# Patient Record
Sex: Male | Born: 1960 | Race: Black or African American | Hispanic: No | Marital: Married | Smoking: Never smoker
Health system: Southern US, Community
[De-identification: ages and names within clinical notes are randomized; demographics above are authoritative.]

## PROBLEM LIST (undated history)

## (undated) DIAGNOSIS — I10 Essential (primary) hypertension: Secondary | ICD-10-CM

## (undated) DIAGNOSIS — J309 Allergic rhinitis, unspecified: Secondary | ICD-10-CM

## (undated) HISTORY — DX: Allergic rhinitis, unspecified: J30.9

## (undated) HISTORY — DX: Essential (primary) hypertension: I10

## (undated) HISTORY — PX: KNEE SURGERY: SHX244

---

## 1999-01-21 ENCOUNTER — Emergency Department (HOSPITAL_COMMUNITY): Admission: EM | Admit: 1999-01-21 | Discharge: 1999-01-21 | Payer: Self-pay | Admitting: Emergency Medicine

## 1999-01-21 ENCOUNTER — Encounter: Payer: Self-pay | Admitting: Emergency Medicine

## 1999-02-04 ENCOUNTER — Emergency Department (HOSPITAL_COMMUNITY): Admission: EM | Admit: 1999-02-04 | Discharge: 1999-02-04 | Payer: Self-pay

## 1999-10-18 ENCOUNTER — Emergency Department (HOSPITAL_COMMUNITY): Admission: EM | Admit: 1999-10-18 | Discharge: 1999-10-19 | Payer: Self-pay | Admitting: Emergency Medicine

## 1999-10-19 ENCOUNTER — Encounter: Payer: Self-pay | Admitting: Emergency Medicine

## 2000-07-09 ENCOUNTER — Emergency Department (HOSPITAL_COMMUNITY): Admission: EM | Admit: 2000-07-09 | Discharge: 2000-07-09 | Payer: Self-pay | Admitting: Emergency Medicine

## 2000-09-27 ENCOUNTER — Emergency Department (HOSPITAL_COMMUNITY): Admission: EM | Admit: 2000-09-27 | Discharge: 2000-09-28 | Payer: Self-pay | Admitting: Emergency Medicine

## 2000-10-06 ENCOUNTER — Emergency Department (HOSPITAL_COMMUNITY): Admission: EM | Admit: 2000-10-06 | Discharge: 2000-10-06 | Payer: Self-pay | Admitting: Emergency Medicine

## 2000-11-25 ENCOUNTER — Emergency Department (HOSPITAL_COMMUNITY): Admission: EM | Admit: 2000-11-25 | Discharge: 2000-11-25 | Payer: Self-pay | Admitting: Emergency Medicine

## 2000-11-25 ENCOUNTER — Encounter: Payer: Self-pay | Admitting: Emergency Medicine

## 2000-11-27 ENCOUNTER — Encounter: Payer: Self-pay | Admitting: Emergency Medicine

## 2000-11-27 ENCOUNTER — Emergency Department (HOSPITAL_COMMUNITY): Admission: EM | Admit: 2000-11-27 | Discharge: 2000-11-27 | Payer: Self-pay | Admitting: Emergency Medicine

## 2000-12-04 ENCOUNTER — Emergency Department (HOSPITAL_COMMUNITY): Admission: EM | Admit: 2000-12-04 | Discharge: 2000-12-04 | Payer: Self-pay | Admitting: Emergency Medicine

## 2003-08-26 ENCOUNTER — Emergency Department (HOSPITAL_COMMUNITY): Admission: EM | Admit: 2003-08-26 | Discharge: 2003-08-26 | Payer: Self-pay | Admitting: Emergency Medicine

## 2004-05-27 ENCOUNTER — Encounter: Admission: RE | Admit: 2004-05-27 | Discharge: 2004-05-27 | Payer: Self-pay | Admitting: Family Medicine

## 2004-08-02 ENCOUNTER — Emergency Department (HOSPITAL_COMMUNITY): Admission: EM | Admit: 2004-08-02 | Discharge: 2004-08-02 | Payer: Self-pay | Admitting: Emergency Medicine

## 2004-08-23 ENCOUNTER — Encounter: Admission: RE | Admit: 2004-08-23 | Discharge: 2004-08-23 | Payer: Self-pay | Admitting: Family Medicine

## 2006-02-23 ENCOUNTER — Ambulatory Visit: Payer: Self-pay | Admitting: Family Medicine

## 2007-01-01 ENCOUNTER — Encounter: Admission: RE | Admit: 2007-01-01 | Discharge: 2007-01-01 | Payer: Self-pay | Admitting: Cardiovascular Disease

## 2007-04-24 DIAGNOSIS — I10 Essential (primary) hypertension: Secondary | ICD-10-CM | POA: Insufficient documentation

## 2008-04-14 ENCOUNTER — Telehealth: Payer: Self-pay | Admitting: Family Medicine

## 2008-04-20 ENCOUNTER — Telehealth: Payer: Self-pay | Admitting: Family Medicine

## 2008-09-18 ENCOUNTER — Telehealth: Payer: Self-pay | Admitting: Family Medicine

## 2008-10-29 ENCOUNTER — Ambulatory Visit: Payer: Self-pay | Admitting: Family Medicine

## 2008-10-29 LAB — CONVERTED CEMR LAB
ALT: 32 units/L (ref 0–53)
AST: 27 units/L (ref 0–37)
Albumin: 4.4 g/dL (ref 3.5–5.2)
Alkaline Phosphatase: 44 units/L (ref 39–117)
BUN: 16 mg/dL (ref 6–23)
Basophils Absolute: 0 10*3/uL (ref 0.0–0.1)
Basophils Relative: 0.3 % (ref 0.0–3.0)
Bilirubin Urine: NEGATIVE
Bilirubin, Direct: 0.2 mg/dL (ref 0.0–0.3)
CO2: 31 meq/L (ref 19–32)
Calcium: 9.3 mg/dL (ref 8.4–10.5)
Chloride: 104 meq/L (ref 96–112)
Cholesterol: 205 mg/dL — ABNORMAL HIGH (ref 0–200)
Creatinine, Ser: 1.1 mg/dL (ref 0.4–1.5)
Direct LDL: 147.8 mg/dL
Eosinophils Absolute: 0.1 10*3/uL (ref 0.0–0.7)
Eosinophils Relative: 4.1 % (ref 0.0–5.0)
GFR calc non Af Amer: 91.31 mL/min (ref >60)
Glucose, Bld: 94 mg/dL (ref 70–99)
Glucose, Urine, Semiquant: NEGATIVE
HCT: 44.7 % (ref 39.0–52.0)
HDL: 41 mg/dL (ref >39.00)
Hemoglobin: 15.6 g/dL (ref 13.0–17.0)
Ketones, urine, test strip: NEGATIVE
Lymphocytes Relative: 44.1 % (ref 12.0–46.0)
Lymphs Abs: 1.5 10*3/uL (ref 0.7–4.0)
MCHC: 34.9 g/dL (ref 30.0–36.0)
MCV: 92.6 fL (ref 78.0–100.0)
Monocytes Absolute: 0.2 10*3/uL (ref 0.1–1.0)
Monocytes Relative: 6.6 % (ref 3.0–12.0)
Neutro Abs: 1.5 10*3/uL (ref 1.4–7.7)
Neutrophils Relative %: 44.9 % (ref 43.0–77.0)
Nitrite: NEGATIVE
PSA: 0.42 ng/mL (ref 0.10–4.00)
Platelets: 186 10*3/uL (ref 150.0–400.0)
Potassium: 3.4 meq/L — ABNORMAL LOW (ref 3.5–5.1)
Protein, U semiquant: NEGATIVE
RBC: 4.82 M/uL (ref 4.22–5.81)
RDW: 13.3 % (ref 11.5–14.6)
Sodium: 141 meq/L (ref 135–145)
Specific Gravity, Urine: 1.015
TSH: 1.33 microintl units/mL (ref 0.35–5.50)
Total Bilirubin: 1.6 mg/dL — ABNORMAL HIGH (ref 0.3–1.2)
Total CHOL/HDL Ratio: 5
Total Protein: 7.9 g/dL (ref 6.0–8.3)
Triglycerides: 67 mg/dL (ref 0.0–149.0)
Urobilinogen, UA: 0.2
VLDL: 13.4 mg/dL (ref 0.0–40.0)
WBC Urine, dipstick: NEGATIVE
WBC: 3.3 10*3/uL — ABNORMAL LOW (ref 4.5–10.5)
pH: 6.5

## 2008-11-05 ENCOUNTER — Ambulatory Visit: Payer: Self-pay | Admitting: Family Medicine

## 2008-11-05 DIAGNOSIS — F528 Other sexual dysfunction not due to a substance or known physiological condition: Secondary | ICD-10-CM | POA: Insufficient documentation

## 2008-11-05 DIAGNOSIS — J309 Allergic rhinitis, unspecified: Secondary | ICD-10-CM | POA: Insufficient documentation

## 2008-12-17 ENCOUNTER — Ambulatory Visit: Payer: Self-pay | Admitting: Family Medicine

## 2009-12-31 ENCOUNTER — Telehealth: Payer: Self-pay | Admitting: Family Medicine

## 2010-02-22 ENCOUNTER — Observation Stay (HOSPITAL_COMMUNITY): Admission: EM | Admit: 2010-02-22 | Discharge: 2010-02-23 | Payer: Self-pay | Admitting: Emergency Medicine

## 2010-03-31 ENCOUNTER — Ambulatory Visit: Payer: Self-pay | Admitting: Family Medicine

## 2010-05-27 ENCOUNTER — Ambulatory Visit: Payer: Self-pay | Admitting: Family Medicine

## 2010-07-08 ENCOUNTER — Ambulatory Visit: Payer: Self-pay | Admitting: Family Medicine

## 2010-08-30 NOTE — Progress Notes (Signed)
Summary: REFILL  Phone Note Call from Patient Call back at Home Phone [phone]   Caller: PT LIVE Call For: Lorence Nagengast Summary of Call: [facility] [address] [place] TRIAMTERENE HCTZ 5-25 HE MADE HIS APPT FOR CPX ON 4-8.  PLKEASE FILL TILL THEN.   Initial call taken by: Roselle Locus,  September 18, 2008 3:22 PM    New/Updated Medications: MAXZIDE-25 37.5-25 MG  TABS (TRIAMTERENE-HCTZ) take one tab once daily HYDROCHLOROTHIAZIDE 12.5 MG  CAPS (HYDROCHLOROTHIAZIDE) take one tab once daily   Prescriptions: HYDROCHLOROTHIAZIDE 12.5 MG  CAPS (HYDROCHLOROTHIAZIDE) take one tab once daily  #90 x 0   Entered by:   Kern Reap CMA   Authorized by:   Roderick Pee MD   Signed by:   Kern Reap CMA on 09/18/2008   Method used:   Electronically to        [facility] [phone]* (retail)       [address]       [place], [place]  [address]       Ph: [phone]       Fax: [phone]   RxID:   [id number] MAXZIDE-25 37.5-25 MG  TABS (TRIAMTERENE-HCTZ) take one tab once daily  #90 x 0   Entered by:   Kern Reap CMA   Authorized by:   Roderick Pee MD   Signed by:   Kern Reap CMA on 09/18/2008   Method used:   Electronically to        [facility] [phone]* (retail)       [address]       [place], [place]  [address]       Ph: [phone]       Fax: [phone]   RxID:   [id number] TRIAMTERENE-HCTZ 37.5-25 MG  TABS (TRIAMTERENE-HCTZ) 1 TAB  QAM  #90 x 0   Entered by:   Kern Reap CMA   Authorized by:   Roderick Pee MD   Signed by:   Kern Reap CMA on 09/18/2008   Method used:   Electronically to        [facility] [phone]* (retail)       [address]       [place], [place]  [address]       Ph: [phone]       Fax: [phone]   RxID:   [id number]

## 2010-08-30 NOTE — Progress Notes (Signed)
Summary: triam denied  Phone Note From Pharmacy   Caller: [facility] [phone]* Details for Reason: refill Summary of Call: pharm request refill triam/hctz  Follow-up for Phone Call        denied. Follow-up by: Kern Reap CMA,  April 14, 2008 2:00 PM      Prescriptions: TRIAMTERENE-HCTZ 37.5-25 MG  TABS (TRIAMTERENE-HCTZ) 1 TAB  QAM  #0 x 0   Entered by:   Kern Reap CMA   Authorized by:   Roderick Pee MD   Signed by:   Kern Reap CMA on 04/14/2008   Method used:   Electronically to        [facility] [phone]* (retail)       [address]       [place], [place]  [address]       Ph: [phone]       Fax: [phone]   RxID:   [id number]

## 2010-08-30 NOTE — Assessment & Plan Note (Signed)
Summary: 3 wk rov/njr/pt rescd//ccm   Vital Signs:  Patient profile:   50 year old male Weight:      211 pounds Temp:     98.2 degrees F oral BP sitting:   160 / 110  (left arm) Cuff size:   regular  Vitals Entered By: Kern Reap CMA (Dec 17, 2008 10:35 AM)  Reason for Visit follow up HTN  History of Present Illness: Marsden is a 50 year old male, who comes in today for evaluation of hypertension.  In April.  We started him on lisinopril 10 mg daily.  He, states she's been compliant with his medication, but he is not been checking his blood pressure at home.  BP here initially 160/108 with his machine 169/109 after rest 144/90.  He's had no side effects from his medication  Allergies (verified): No Known Drug Allergies PMH-FH-SH reviewed-no changes except otherwise noted  Family History:    Reviewed history from 11/05/2008 and no changes required:       Family History Hypertension       Father: HTN       Mother: deceased HTN       Siblings: 3 brothers - HTN 3 sisters HTN  Social History:    Reviewed history from 04/24/2007 and no changes required:       Occupation:       Married       Never Smoked       Alcohol use-no       Drug use-no  Review of Systems      See HPI  Physical Exam  General:  Well-developed,well-nourished,in no acute distress; alert,appropriate and cooperative throughout examination Heart:  144/90   Impression & Recommendations:  Problem # 1:  HYPERTENSION (ICD-401.9) Assessment Improved  His updated medication list for this problem includes:    Lisinopril 10 Mg Tabs (Lisinopril) .Marland Kitchen... Take 1 tablet by mouth every morning    Lisinopril 20 Mg Tabs (Lisinopril) .Marland Kitchen... Take 1 tablet by mouth every morning  Orders: Prescription Created Electronically [phone])  Complete Medication List: 1)  Lisinopril 10 Mg Tabs (Lisinopril) .... Take 1 tablet by mouth every morning 2)  Viagra 50 Mg Tabs (Sildenafil citrate) .... Uad 3)  Lisinopril 20 Mg  Tabs (Lisinopril) .... Take 1 tablet by mouth every morning  Patient Instructions: 1)  increase the lisinopril to 20 mg daily.  Check a morning blood sugar daily for 3 weeks.  If at the end of 3 weeks.  Her blood pressure is normal.  That is the top number is less than 135 and the bottom number is less than 85, then we are at goal. 2)  If after 3 weeks if blood pressure is not at goal to increase your lisinopril to 30 mg daily. 3)  In other words we will increase her lisinopril by 10 mg every 3 weeks and two, your blood pressure drops to normal.  maximum dosage  is 80 mg daily. Prescriptions: LISINOPRIL 20 MG TABS (LISINOPRIL) Take 1 tablet by mouth every morning  #100 x 3   Entered and Authorized by:   Roderick Pee MD   Signed by:   Roderick Pee MD on 12/17/2008   Method used:   Electronically to        [facility] [phone]* (retail)       [address]       [place], [place]  [address]  Ph: [phone]       Fax: [phone]   RxID:   [id number]

## 2010-08-30 NOTE — Progress Notes (Signed)
Summary: rx refill  Phone Note Refill Request Message from:  Fax from Pharmacy on December 31, 2009 1:01 PM  Refills Requested: Medication #1:  LISINOPRIL 20 MG TABS Take 1 tablet by mouth every morning. Initial call taken by: Kern Reap CMA (AAMA),  December 31, 2009 1:01 PM    Prescriptions: LISINOPRIL 10 MG TABS (LISINOPRIL) Take 1 tablet by mouth every morning  #30 x 0   Entered by:   Kern Reap CMA (AAMA)   Authorized by:   Roderick Pee MD   Signed by:   Kern Reap CMA (AAMA) on 12/31/2009   Method used:   [facility]        [phone]       [address]       [place], [place]  [address]       Ph: [phone] or [phone]       Fax: [phone]   RxID:   [id number]

## 2010-08-30 NOTE — Assessment & Plan Note (Signed)
Summary: 4 weeks fup//ccm---[facility] // RS PT RSC/NJR   Vital Signs:  Patient profile:   50 year old male Height:      68 inches Weight:      203 pounds BMI:     30.98 Temp:     98.5 degrees F oral BP sitting:   150 / 98  (left arm) Cuff size:   regular  Vitals Entered By: Kern Reap CMA Duncan Dull) (May 27, 2010 10:30 AM) CC: follow-up visit   CC:  follow-up visit.  History of Present Illness: Phillip Scott is a 50 year old male, who comes in today for follow-up of hypertension.  We increased his Norvasc to 5 mg daily on September the first.  BP 150/98.  Blood pressure at home are consistent with what were getting here.  Allergies: 1)  ! * Lisinopril  Social History: Reviewed history from 04/24/2007 and no changes required. Occupation: Married Never Smoked Alcohol use-no Drug use-no  Review of Systems      See HPI       Flu Vaccine Consent Questions     Do you have a history of severe allergic reactions to this vaccine? no    Any prior history of allergic reactions to egg and/or gelatin? no    Do you have a sensitivity to the preservative Thimersol? no    Do you have a past history of Guillan-Barre Syndrome? no    Do you currently have an acute febrile illness? no    Have you ever had a severe reaction to latex? no    Vaccine information given and explained to patient? yes    Are you currently pregnant? no    Lot Number:[id number]   Exp Date:01/28/2011   Site Given  Left Deltoid IM    Physical Exam  General:  Well-developed,well-nourished,in no acute distress; alert,appropriate and cooperative throughout examination Extremities:  1+ left pedal edema and 1+ right pedal edema.     Impression & Recommendations:  Problem # 1:  HYPERTENSION (ICD-401.9) Assessment Unchanged  His updated medication list for this problem includes:    Amlodipine Besylate 10 Mg Tabs (Amlodipine besylate) .Marland Kitchen... Take 1 tablet by mouth every morning    Maxzide-25 37.5-25 Mg Tabs  (Triamterene-hctz) .Marland Kitchen... Take 1 tablet by mouth every morning  Orders: Prescription Created Electronically [phone])  Complete Medication List: 1)  Viagra 50 Mg Tabs (Sildenafil citrate) .... Uad 2)  Amlodipine Besylate 10 Mg Tabs (Amlodipine besylate) .... Take 1 tablet by mouth every morning 3)  Maxzide-25 37.5-25 Mg Tabs (Triamterene-hctz) .... Take 1 tablet by mouth every morning  Other Orders: Admin 1st Vaccine (60454) Flu Vaccine 55yrs + (09811)  Patient Instructions: 1)  continued the amlodipine 10 mg daily in the morning.  And Maxide 25 mg one tablet daily in the morning. 2)  Check your blood pressure daily in the morning.  Return in one month for follow-up with the data and the device Prescriptions: MAXZIDE-25 37.5-25 MG TABS (TRIAMTERENE-HCTZ) Take 1 tablet by mouth every morning  #100 x 3   Entered and Authorized by:   Roderick Pee MD   Signed by:   Roderick Pee MD on 05/27/2010   Method used:   Electronically to        [facility] [address] (retail)       [phone]       [place], [place]  [address]       Ph: [phone]  Fax: [phone]   RxID:   [id number]    Orders Added: 1)  Admin 1st Vaccine [90471] 2)  Flu Vaccine 3yrs + [30865] 3)  Prescription Created Electronically [G8553] 4)  Est. Patient Level III [78469]

## 2010-08-30 NOTE — Progress Notes (Signed)
Summary: REFILL  Phone Note Call from Patient Call back at Home Phone [phone] Call back at [phone]   Caller: PT LIVE Call For: Janvi Ammar Summary of Call: TRIAMTERINE REFILL [facility]  Initial call taken by: Roselle Locus,  April 20, 2008 9:39 AM  Follow-up for Phone Call        patient has not been seen in 2 year.  rx has been denied. Follow-up by: Kern Reap CMA,  April 20, 2008 9:49 AM  Additional Follow-up for Phone Call Additional follow up Details #1::        okay per dr Kaijah Abts Additional Follow-up by: Kern Reap CMA,  April 20, 2008 1:27 PM      Prescriptions: TRIAMTERENE-HCTZ 37.5-25 MG  TABS (TRIAMTERENE-HCTZ) 1 TAB  QAM  #100 x 0   Entered by:   Kern Reap CMA   Authorized by:   Roderick Pee MD   Signed by:   Kern Reap CMA on 04/20/2008   Method used:   Electronically to        [facility] [phone]* (retail)       [address]       [place], [place]  [address]       Ph: [phone]       Fax: [phone]   RxID:   [id number]

## 2010-08-30 NOTE — Assessment & Plan Note (Signed)
Summary: post hos fup ?meds/njr   Vital Signs:  Patient profile:   50 year old male Weight:      204 pounds Temp:     97.9 degrees F oral BP sitting:   150 / 98  (left arm) Cuff size:   regular  Vitals Entered By: Kathrynn Speed CMA (March 31, 2010 9:50 AM) CC: Post hosp fup, src   CC:  Post hosp fup and src.  History of Present Illness: Phillip Scott this is dear male, who comes in today for evaluation of hypertension.  We saw him in the spring and did a complete physical examination and start him on lisinopril 10 mg daily for hypertension.  He had no initial problem with the medication until July, the 26, when he developed acute urticaria swelling of his lips and upper airway.  He presented to the emergency room and was admitted overnight given steroids and discharged on amlodipine 5 mg daily.  BP today 150 over hundred.  No edema  Current Medications (verified): 1)  Viagra 50 Mg Tabs (Sildenafil Citrate) .... Uad 2)  Amlodipine Besylate 5 Mg Tabs (Amlodipine Besylate) .... Take One Tablet Every Day  Allergies (verified): 1)  ! * Lisinopril  Past History:  Past medical, surgical, family and social histories (including risk factors) reviewed, and no changes noted (except as noted below).  Past Medical History: Reviewed history from 11/05/2008 and no changes required. Hypertension Allergic rhinitis  Past Surgical History: Reviewed history from 04/24/2007 and no changes required. Knee Surgery x2  Family History: Reviewed history from 11/05/2008 and no changes required. Family History Hypertension Father: HTN Mother: deceased HTN Siblings: 3 brothers - HTN 3 sisters HTN  Social History: Reviewed history from 04/24/2007 and no changes required. Occupation: Married Never Smoked Alcohol use-no Drug use-no  Review of Systems      See HPI  Physical Exam  General:  Well-developed,well-nourished,in no acute distress; alert,appropriate and cooperative throughout  examination Heart:  150 over hundred   Impression & Recommendations:  Problem # 1:  HYPERTENSION (ICD-401.9) Assessment Deteriorated  The following medications were removed from the medication list:    Lisinopril 10 Mg Tabs (Lisinopril) .Marland Kitchen... Take 1 tablet by mouth every morning    Lisinopril 20 Mg Tabs (Lisinopril) .Marland Kitchen... Take 1 tablet by mouth every morning His updated medication list for this problem includes:    Amlodipine Besylate 10 Mg Tabs (Amlodipine besylate) .Marland Kitchen... Take 1 tablet by mouth every morning  Orders: Prescription Created Electronically [phone])  Complete Medication List: 1)  Viagra 50 Mg Tabs (Sildenafil citrate) .... Uad 2)  Amlodipine Besylate 10 Mg Tabs (Amlodipine besylate) .... Take 1 tablet by mouth every morning  Patient Instructions: 1)  increase the Norvasc to 10 mg daily.  Check your blood pressure daily in the morning.  Return in 4 weeks with the data and the device Prescriptions: AMLODIPINE BESYLATE 10 MG TABS (AMLODIPINE BESYLATE) Take 1 tablet by mouth every morning  #100 x 3   Entered and Authorized by:   Roderick Pee MD   Signed by:   Roderick Pee MD on 03/31/2010   Method used:   Electronically to        [facility] [phone]* (retail)       [address]       [place], [place]  [address]       Ph: [phone]       Fax: [phone]  RxID:   [id number]

## 2010-08-30 NOTE — Assessment & Plan Note (Signed)
Summary: CPX/MHF   Vital Signs:  Patient profile:   50 year old male Height:      68 inches Weight:      206 pounds BMI:     31.44 BP sitting:   140 / 98  (left arm) Cuff size:   regular  Vitals Entered By: Kern Reap CMA (November 05, 2008 10:31 AM)  Reason for Visit cpx  History of Present Illness: [name] is a 50 year old male, who was last here in July of 2007 who comes in today for evaluation of hypertension and erectile dysfunction.  Is always been in excellent health.  He said no chronic health problems.  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  Asking to talk to his sister to see what happened.  He has been on Maxzide one daily for hypertension.  BP is running 140 to 150/90.  He's otherwise been in excellent health.  He does get routine eye care and he wears reading glasses.  He does not get routine dental care.  He is having some erectile dysfunction problems.  He also has seasonal allergic rhinitis, for which he takes over-the-counter Claritin.  Last tetanus booster unknown.  Will give him a booster today.  Allergies (verified): No Known Drug Allergies  Past History:  Past medical, surgical, family and social histories (including risk factors) reviewed for relevance to current acute and chronic problems. Past medical history reviewed for relevance to current acute and chronic problems. Past surgical history reviewed for relevance to current acute and chronic problems. Family history reviewed for relevance to current acute and chronic problems. Social history (including risk factors) reviewed for relevance to current acute and chronic problems.  Past Medical History:    Hypertension    Allergic rhinitis  Past Surgical History:    Reviewed history from 04/24/2007 and no changes required:    Knee Surgery x2  Family History:    Reviewed history from 04/24/2007 and no changes  required:       Family History Hypertension       Father: HTN       Mother: deceased HTN       Siblings: 3 brothers - HTN 3 sisters HTN  Social History:    Reviewed history from 04/24/2007 and no changes required:       Occupation:       Married       Never Smoked       Alcohol use-no       Drug use-no  Review of Systems      See HPI  Physical Exam  General:  Well-developed,well-nourished,in no acute distress; alert,appropriate and cooperative throughout examination Head:  Normocephalic and atraumatic without obvious abnormalities. No apparent alopecia or balding. Eyes:  No corneal or conjunctival inflammation noted. EOMI. Perrla. Funduscopic exam benign, without hemorrhages, exudates or papilledema. Vision grossly normal. Ears:  External ear exam shows no significant lesions or deformities.  Otoscopic examination reveals clear canals, tympanic membranes are intact bilaterally without bulging, retraction, inflammation or discharge. Hearing is grossly normal bilaterally. Nose:  External nasal examination shows no deformity or inflammation. Nasal mucosa are pink and moist without lesions or exudates. Mouth:  Oral mucosa and oropharynx without lesions or exudates.  Teeth in good repair. Neck:  No deformities, masses, or tenderness noted. Chest Wall:  No deformities, masses, tenderness or gynecomastia noted. Breasts:  No masses or gynecomastia  noted Lungs:  Normal respiratory effort, chest expands symmetrically. Lungs are clear to auscultation, no crackles or wheezes. Heart:  Normal rate and regular rhythm. S1 and S2 normal without gallop, murmur, click, rub or other extra sounds. Abdomen:  Bowel sounds positive,abdomen soft and non-tender without masses, organomegaly or hernias noted. Rectal:  No external abnormalities noted. Normal sphincter tone. No rectal masses or tenderness. Genitalia:  Testes bilaterally descended without nodularity, tenderness or masses. No scrotal masses or  lesions. No penis lesions or urethral discharge. Prostate:  Prostate gland firm and smooth, no enlargement, nodularity, tenderness, mass, asymmetry or induration. Msk:  No deformity or scoliosis noted of thoracic or lumbar spine.   Pulses:  R and L carotid,radial,femoral,dorsalis pedis and posterior tibial pulses are full and equal bilaterally Extremities:  No clubbing, cyanosis, edema, or deformity noted with normal full range of motion of all joints.   Neurologic:  No cranial nerve deficits noted. Station and gait are normal. Plantar reflexes are down-going bilaterally. DTRs are symmetrical throughout. Sensory, motor and coordinative functions appear intact. Skin:  Intact without suspicious lesions or rashes Cervical Nodes:  No lymphadenopathy noted Axillary Nodes:  No palpable lymphadenopathy Inguinal Nodes:  No significant adenopathy Psych:  Cognition and judgment appear intact. Alert and cooperative with normal attention span and concentration. No apparent delusions, illusions, hallucinations   Impression & Recommendations:  Problem # 1:  ALLERGIC RHINITIS (ICD-477.9) Assessment Improved  Problem # 2:  HYPERTENSION (ICD-401.9) Assessment: Deteriorated  The following medications were removed from the medication list:    Triamterene-hctz 37.5-25 Mg Tabs (Triamterene-hctz) .Marland Kitchen... 1 tab  qam    Maxzide-25 37.5-25 Mg Tabs (Triamterene-hctz) .Marland Kitchen... Take one tab once daily    Hydrochlorothiazide 12.5 Mg Caps (Hydrochlorothiazide) .Marland Kitchen... Take one tab once daily His updated medication list for this problem includes:    Lisinopril 10 Mg Tabs (Lisinopril) .Marland Kitchen... Take 1 tablet by mouth every morning  Orders: EKG w/ Interpretation (93000)  Problem # 3:  ERECTILE DYSFUNCTION (ICD-302.72) Assessment: New  His updated medication list for this problem includes:    Viagra 50 Mg Tabs (Sildenafil citrate) ..... Uad  Complete Medication List: 1)  Lisinopril 10 Mg Tabs (Lisinopril) .... Take 1  tablet by mouth every morning 2)  Viagra 50 Mg Tabs (Sildenafil citrate) .... Uad  Other Orders: Tetanus Toxoid w/Dx [phone]) Admin 1st Vaccine (96295)  Patient Instructions: 1)  stop the triamterene start Zestril 10 mg one daily in the morning.  Check her blood pressure every morning.  Return in two weeks for follow-up.  When you return bring a record of all your blood pressure readings and the device too.   2)  Take an Aspirin every day. 3)  also try Viagra 50 mg one half tablet one hour prior to sex remember to take the medication with water Prescriptions: VIAGRA 50 MG TABS (SILDENAFIL CITRATE) UAD  #6 x 11   Entered and Authorized by:   Roderick Pee MD   Signed by:   Roderick Pee MD on 11/05/2008   Method used:   Electronically to        [facility] [phone]* (retail)       [address]       [place], [place]  [address]       Ph: [phone]       Fax: [phone]   RxID:   [id number] LISINOPRIL 10 MG TABS (LISINOPRIL) Take 1 tablet by mouth every  morning  #100 x 3   Entered and Authorized by:   Roderick Pee MD   Signed by:   Roderick Pee MD on 11/05/2008   Method used:   Electronically to        [facility] [phone]* (retail)       [address]       [place], [place]  [address]       Ph: [phone]       Fax: [phone]   RxID:   [id number]        Immunizations Administered:  Tetanus Vaccine:    Vaccine Type: Td    Site: right deltoid    Mfr: GlaxoSmithKline    Dose: 0.5 ml    Route: IM    Given by: Kern Reap CMA    Exp. Date: 09/23/2010    Lot #: [id number]    Physician counseled: yes

## 2010-10-15 LAB — SEDIMENTATION RATE: Sed Rate: 34 mm/hr — ABNORMAL HIGH (ref 0–16)

## 2010-10-15 LAB — URINALYSIS, MICROSCOPIC ONLY
Bilirubin Urine: NEGATIVE
Glucose, UA: NEGATIVE mg/dL
Ketones, ur: NEGATIVE mg/dL
Nitrite: NEGATIVE
Protein, ur: 30 mg/dL — AB
Specific Gravity, Urine: 1.019 (ref 1.005–1.030)
Urobilinogen, UA: 1 mg/dL (ref 0.0–1.0)
pH: 6.5 (ref 5.0–8.0)

## 2010-10-15 LAB — COMPREHENSIVE METABOLIC PANEL
ALT: 16 U/L (ref 0–53)
ALT: 18 U/L (ref 0–53)
AST: 16 U/L (ref 0–37)
AST: 17 U/L (ref 0–37)
Albumin: 3.3 g/dL — ABNORMAL LOW (ref 3.5–5.2)
Albumin: 3.7 g/dL (ref 3.5–5.2)
Alkaline Phosphatase: 45 U/L (ref 39–117)
Alkaline Phosphatase: 51 U/L (ref 39–117)
BUN: 13 mg/dL (ref 6–23)
BUN: 15 mg/dL (ref 6–23)
CO2: 26 mEq/L (ref 19–32)
CO2: 28 mEq/L (ref 19–32)
Calcium: 8.9 mg/dL (ref 8.4–10.5)
Calcium: 9.2 mg/dL (ref 8.4–10.5)
Chloride: 108 mEq/L (ref 96–112)
Chloride: 109 mEq/L (ref 96–112)
Creatinine, Ser: 0.94 mg/dL (ref 0.4–1.5)
Creatinine, Ser: 1.1 mg/dL (ref 0.4–1.5)
GFR calc Af Amer: >60 mL/min (ref >60)
GFR calc Af Amer: >60 mL/min (ref >60)
GFR calc non Af Amer: >60 mL/min (ref >60)
GFR calc non Af Amer: >60 mL/min (ref >60)
Glucose, Bld: 113 mg/dL — ABNORMAL HIGH (ref 70–99)
Glucose, Bld: 144 mg/dL — ABNORMAL HIGH (ref 70–99)
Potassium: 4.2 mEq/L (ref 3.5–5.1)
Potassium: 4.3 mEq/L (ref 3.5–5.1)
Sodium: 141 mEq/L (ref 135–145)
Sodium: 142 mEq/L (ref 135–145)
Total Bilirubin: 1 mg/dL (ref 0.3–1.2)
Total Bilirubin: 1.2 mg/dL (ref 0.3–1.2)
Total Protein: 7.4 g/dL (ref 6.0–8.3)
Total Protein: 7.8 g/dL (ref 6.0–8.3)

## 2010-10-15 LAB — CBC
HCT: 42.8 % (ref 39.0–52.0)
HCT: 43 % (ref 39.0–52.0)
Hemoglobin: 14.9 g/dL (ref 13.0–17.0)
Hemoglobin: 14.9 g/dL (ref 13.0–17.0)
MCH: 32.8 pg (ref 26.0–34.0)
MCH: 33.4 pg (ref 26.0–34.0)
MCHC: 34.7 g/dL (ref 30.0–36.0)
MCHC: 34.8 g/dL (ref 30.0–36.0)
MCV: 94.4 fL (ref 78.0–100.0)
MCV: 96.3 fL (ref 78.0–100.0)
Platelets: 196 10*3/uL (ref 150–400)
Platelets: 217 10*3/uL (ref 150–400)
RBC: 4.47 MIL/uL (ref 4.22–5.81)
RBC: 4.53 MIL/uL (ref 4.22–5.81)
RDW: 12.9 % (ref 11.5–15.5)
RDW: 13 % (ref 11.5–15.5)
WBC: 4.5 10*3/uL (ref 4.0–10.5)
WBC: 6.5 10*3/uL (ref 4.0–10.5)

## 2010-10-15 LAB — BASIC METABOLIC PANEL
BUN: 13 mg/dL (ref 6–23)
CO2: 24 mEq/L (ref 19–32)
Calcium: 9.1 mg/dL (ref 8.4–10.5)
Chloride: 108 mEq/L (ref 96–112)
Creatinine, Ser: 0.95 mg/dL (ref 0.4–1.5)
GFR calc Af Amer: >60 mL/min (ref >60)
GFR calc non Af Amer: >60 mL/min (ref >60)
Glucose, Bld: 95 mg/dL (ref 70–99)
Potassium: 4.2 mEq/L (ref 3.5–5.1)
Sodium: 140 mEq/L (ref 135–145)

## 2010-10-15 LAB — DIFFERENTIAL
Basophils Absolute: 0 10*3/uL (ref 0.0–0.1)
Basophils Absolute: 0 10*3/uL (ref 0.0–0.1)
Basophils Relative: 0 % (ref 0–1)
Basophils Relative: 1 % (ref 0–1)
Eosinophils Absolute: 0 10*3/uL (ref 0.0–0.7)
Eosinophils Absolute: 0.2 10*3/uL (ref 0.0–0.7)
Eosinophils Relative: 0 % (ref 0–5)
Eosinophils Relative: 5 % (ref 0–5)
Lymphocytes Relative: 21 % (ref 12–46)
Lymphocytes Relative: 9 % — ABNORMAL LOW (ref 12–46)
Lymphs Abs: 0.6 10*3/uL — ABNORMAL LOW (ref 0.7–4.0)
Lymphs Abs: 0.9 10*3/uL (ref 0.7–4.0)
Monocytes Absolute: 0.1 10*3/uL (ref 0.1–1.0)
Monocytes Absolute: 0.3 10*3/uL (ref 0.1–1.0)
Monocytes Relative: 1 % — ABNORMAL LOW (ref 3–12)
Monocytes Relative: 7 % (ref 3–12)
Neutro Abs: 3 10*3/uL (ref 1.7–7.7)
Neutro Abs: 5.8 10*3/uL (ref 1.7–7.7)
Neutrophils Relative %: 66 % (ref 43–77)
Neutrophils Relative %: 89 % — ABNORMAL HIGH (ref 43–77)

## 2010-10-15 LAB — ANA: Anti Nuclear Antibody(ANA): NEGATIVE

## 2010-10-15 LAB — MAGNESIUM: Magnesium: 2.1 mg/dL (ref 1.5–2.5)

## 2010-10-15 LAB — TSH: TSH: 0.304 u[IU]/mL — ABNORMAL LOW (ref 0.350–4.500)

## 2010-10-15 LAB — PROTIME-INR
INR: 1.28 (ref 0.00–1.49)
Prothrombin Time: 15.9 seconds — ABNORMAL HIGH (ref 11.6–15.2)

## 2010-10-15 LAB — PHOSPHORUS: Phosphorus: 3.6 mg/dL (ref 2.3–4.6)

## 2010-10-15 LAB — C-REACTIVE PROTEIN: CRP: 4.5 mg/dL — ABNORMAL HIGH (ref <0.6)

## 2010-10-15 LAB — APTT: aPTT: 32 seconds (ref 24–37)

## 2010-10-15 LAB — C4 COMPLEMENT: Complement C4, Body Fluid: 26 mg/dL (ref 16–47)

## 2010-10-15 LAB — C1 ESTERASE INHIBITOR, FUNCTIONAL: C1INH Functional/C1INH Total MFr SerPl: >100 % (ref >=68)

## 2011-04-03 ENCOUNTER — Other Ambulatory Visit: Payer: Self-pay | Admitting: Family Medicine

## 2012-01-11 ENCOUNTER — Encounter: Payer: Self-pay | Admitting: Family Medicine

## 2012-01-11 ENCOUNTER — Ambulatory Visit (INDEPENDENT_AMBULATORY_CARE_PROVIDER_SITE_OTHER): Payer: Self-pay | Admitting: Family Medicine

## 2012-01-11 DIAGNOSIS — I10 Essential (primary) hypertension: Secondary | ICD-10-CM

## 2012-01-11 DIAGNOSIS — M549 Dorsalgia, unspecified: Secondary | ICD-10-CM | POA: Insufficient documentation

## 2012-01-11 LAB — CBC WITH DIFFERENTIAL/PLATELET
Basophils Absolute: 0 10*3/uL (ref 0.0–0.1)
Basophils Relative: 0.6 % (ref 0.0–3.0)
Eosinophils Absolute: 0.1 10*3/uL (ref 0.0–0.7)
Eosinophils Relative: 2.4 % (ref 0.0–5.0)
HCT: 41.9 % (ref 39.0–52.0)
Hemoglobin: 14.2 g/dL (ref 13.0–17.0)
Lymphocytes Relative: 44 % (ref 12.0–46.0)
Lymphs Abs: 1.4 10*3/uL (ref 0.7–4.0)
MCHC: 34 g/dL (ref 30.0–36.0)
MCV: 94.6 fl (ref 78.0–100.0)
Monocytes Absolute: 0.2 10*3/uL (ref 0.1–1.0)
Monocytes Relative: 7.8 % (ref 3.0–12.0)
Neutro Abs: 1.5 10*3/uL (ref 1.4–7.7)
Neutrophils Relative %: 45.2 % (ref 43.0–77.0)
Platelets: 171 10*3/uL (ref 150.0–400.0)
RBC: 4.43 Mil/uL (ref 4.22–5.81)
RDW: 13.2 % (ref 11.5–14.6)
WBC: 3.2 10*3/uL — ABNORMAL LOW (ref 4.5–10.5)

## 2012-01-11 LAB — BASIC METABOLIC PANEL
BUN: 12 mg/dL (ref 6–23)
CO2: 28 mEq/L (ref 19–32)
Calcium: 8.9 mg/dL (ref 8.4–10.5)
Chloride: 108 mEq/L (ref 96–112)
Creatinine, Ser: 1 mg/dL (ref 0.4–1.5)
GFR: 106.77 mL/min (ref >60.00)
Glucose, Bld: 72 mg/dL (ref 70–99)
Potassium: 3.5 mEq/L (ref 3.5–5.1)
Sodium: 145 mEq/L (ref 135–145)

## 2012-01-11 MED ORDER — DIAZEPAM 2 MG PO TABS: ORAL_TABLET | ORAL | Status: DC

## 2012-01-11 MED ORDER — TRAMADOL HCL 50 MG PO TABS: ORAL_TABLET | ORAL | Status: DC

## 2012-01-11 MED ORDER — LOSARTAN POTASSIUM-HCTZ 100-25 MG PO TABS: 1.0000 | ORAL_TABLET | Freq: Every day | ORAL | Status: DC

## 2012-01-11 NOTE — Progress Notes (Signed)
  Subjective:    Patient ID: Phillip Scott, male    DOB: [birth year], 51 y.o.   MRN: [id number]  HPI Fischer is a 51 year old male who is employed at [employer]. who comes in today for evaluation of 2 problems  He has underlying hypertension and has been off of his medication for about a year BP today 174/120  He has had recurrent episodes of right-sided low back pain since 1983. Last Friday he was helping a friend move was lifting and noticed severe pain in the right side of his hip that went down into his posterior right calf. Initially the pain was a 9 out of 5. It's constant initially was sharp now it's a dull ache. The pain is decreased by stretching its increased by twisting. No bowel nor bladder dysfunction. He denies any neurologic symptoms   Review of Systems    general cardiovascular and neuromuscular review of systems otherwise negative Objective:   Physical Exam Thin male in no acute distress BP right arm sitting position 180/120 pulse 70 and regular  Neurologic exam in the supine position both legs were of equal length the sensation reflexes muscle strength within normal limits straight leg raising negative       Assessment & Plan:  Hypertension weight out of control plan beta blocker 5 mg now along with clonidine 0.2 restart oral meds at home BP checked twice daily followup in 5 days  Lumbar disc disease without neurologic deficit plan Motrin 600 mg twice daily, tramadol and Valium each bedtime bed rest x48 hours

## 2012-01-11 NOTE — Patient Instructions (Signed)
Bedrest at home the rest of today Friday and Saturday,  Sunday ,,,,,,,,,,, get up walk lie down but avoid sitting  Motrin 600 milligrams twice daily  Valium and tramadol,,,,,,,,,,, 1 of each 3 times daily while you're at bedrest  Starting on Sunday take one of each at bedtime only  Check your blood pressure 3 times daily with a pump up digital blood pressure cuff  Return on Monday for followup

## 2012-01-15 ENCOUNTER — Encounter: Payer: Self-pay | Admitting: Family Medicine

## 2012-01-15 ENCOUNTER — Ambulatory Visit (INDEPENDENT_AMBULATORY_CARE_PROVIDER_SITE_OTHER): Payer: Self-pay | Admitting: Family Medicine

## 2012-01-15 VITALS — BP 160/100 | Temp 98.0°F | Wt 189.0 lb

## 2012-01-15 DIAGNOSIS — I10 Essential (primary) hypertension: Secondary | ICD-10-CM

## 2012-01-15 DIAGNOSIS — M549 Dorsalgia, unspecified: Secondary | ICD-10-CM

## 2012-01-15 NOTE — Progress Notes (Signed)
  Subjective:    Patient ID: Phillip Scott, male    DOB: 03-Mar-1961, 51 y.o.   MRN: [id number]  HPI Phillip Scott is a 51 year old male who comes in today for followup of 2 problems  We saw him last week with uncontrolled hypertension because he had stopped his blood pressure medication. We started him on Hyzaar 100-25 daily BP today 160/100. He was in the 190. He we also gave him a half of a 2 mg Valium and a half of a 50 mg tramadol to take at bedtime for low back pain. He states his back pain is improved but not gone    Review of Systems Gen. cardiovascular neurologic review of systems otherwise negative    Objective:   Physical Exam Well-developed well-nourished male in no acute distress BP right arm sitting position 160/100       Assessment & Plan:  Hypertension not at goal continue current medications avoid salt begin exercise program followup in 4 weeks

## 2012-01-15 NOTE — Patient Instructions (Signed)
Continue your current medication,,,,,,,,,,,, Hyzaar one daily  Followup on your blood pressure in the first full week after the holiday  When you return bring a record of all her blood pressure readings and the device  Take a half of a Valium and a half of a tramadol bedtime as needed for low back pain

## 2012-02-08 ENCOUNTER — Ambulatory Visit: Payer: Self-pay | Admitting: Family Medicine

## 2012-02-15 ENCOUNTER — Telehealth: Payer: Self-pay | Admitting: Family Medicine

## 2012-02-15 DIAGNOSIS — M549 Dorsalgia, unspecified: Secondary | ICD-10-CM

## 2012-02-15 NOTE — Telephone Encounter (Signed)
Patient called stating that he need a refill of his valium. Please assist.

## 2012-02-15 NOTE — Telephone Encounter (Signed)
Fleet Contras please call on Friday

## 2012-02-16 MED ORDER — DIAZEPAM 2 MG PO TABS: ORAL_TABLET | ORAL | Status: DC

## 2012-02-16 NOTE — Telephone Encounter (Signed)
Patient called back, I let him know you would have the med called in by 5pm today. He understood and was happy with that - he didn't need anything else, per pt.

## 2012-02-16 NOTE — Telephone Encounter (Signed)
Rx called in 

## 2012-02-28 ENCOUNTER — Ambulatory Visit (INDEPENDENT_AMBULATORY_CARE_PROVIDER_SITE_OTHER): Payer: BC Managed Care – PPO | Admitting: Family Medicine

## 2012-02-28 ENCOUNTER — Encounter: Payer: Self-pay | Admitting: Family Medicine

## 2012-02-28 VITALS — BP 140/90 | Temp 97.9°F | Wt 190.0 lb

## 2012-02-28 DIAGNOSIS — I10 Essential (primary) hypertension: Secondary | ICD-10-CM

## 2012-02-28 NOTE — Patient Instructions (Signed)
Continue your current medication  Set up a time in the next 6-8 weeks for general physical examination  Fasting labs one week prior

## 2012-02-28 NOTE — Progress Notes (Signed)
  Subjective:    Patient ID: Phillip Scott, male    DOB: 1960-10-11, 51 y.o.   MRN: [id number]  HPI Phillip Scott is a 51 year old male who comes in today for followup of hypertension  He's on Hyzaar 100-25 daily BP 132/86 right arm sitting position no side effects from medication    Review of Systems    general and cardiovascular review of systems otherwise negative Objective:   Physical Exam  Well-developed well-nourished male in no acute distress BP normal      Assessment & Plan:  Hypertension at goal continue current therapy return CPX

## 2012-04-11 ENCOUNTER — Other Ambulatory Visit: Payer: Self-pay

## 2012-04-18 ENCOUNTER — Encounter: Payer: Self-pay | Admitting: Family Medicine

## 2012-06-11 ENCOUNTER — Other Ambulatory Visit (INDEPENDENT_AMBULATORY_CARE_PROVIDER_SITE_OTHER): Payer: BC Managed Care – PPO

## 2012-06-11 DIAGNOSIS — Z0000 Encounter for general adult medical examination without abnormal findings: Secondary | ICD-10-CM

## 2012-06-11 LAB — HEPATIC FUNCTION PANEL
ALT: 13 U/L (ref 0–53)
AST: 18 U/L (ref 0–37)
Albumin: 3.8 g/dL (ref 3.5–5.2)
Alkaline Phosphatase: 42 U/L (ref 39–117)
Bilirubin, Direct: 0.2 mg/dL (ref 0.0–0.3)
Total Bilirubin: 1.5 mg/dL — ABNORMAL HIGH (ref 0.3–1.2)
Total Protein: 6.7 g/dL (ref 6.0–8.3)

## 2012-06-11 LAB — CBC WITH DIFFERENTIAL/PLATELET
Basophils Absolute: 0 10*3/uL (ref 0.0–0.1)
Basophils Relative: 0.7 % (ref 0.0–3.0)
Eosinophils Absolute: 0.1 10*3/uL (ref 0.0–0.7)
Eosinophils Relative: 4.8 % (ref 0.0–5.0)
HCT: 42.2 % (ref 39.0–52.0)
Hemoglobin: 14.1 g/dL (ref 13.0–17.0)
Lymphocytes Relative: 47.6 % — ABNORMAL HIGH (ref 12.0–46.0)
Lymphs Abs: 1.2 10*3/uL (ref 0.7–4.0)
MCHC: 33.4 g/dL (ref 30.0–36.0)
MCV: 95.2 fl (ref 78.0–100.0)
Monocytes Absolute: 0.2 10*3/uL (ref 0.1–1.0)
Monocytes Relative: 8 % (ref 3.0–12.0)
Neutro Abs: 1 10*3/uL — ABNORMAL LOW (ref 1.4–7.7)
Neutrophils Relative %: 38.9 % — ABNORMAL LOW (ref 43.0–77.0)
Platelets: 172 10*3/uL (ref 150.0–400.0)
RBC: 4.43 Mil/uL (ref 4.22–5.81)
RDW: 13.5 % (ref 11.5–14.6)
WBC: 2.5 10*3/uL — ABNORMAL LOW (ref 4.5–10.5)

## 2012-06-11 LAB — POCT URINALYSIS DIPSTICK
Bilirubin, UA: NEGATIVE
Glucose, UA: NEGATIVE
Ketones, UA: NEGATIVE
Leukocytes, UA: NEGATIVE
Nitrite, UA: NEGATIVE
Protein, UA: NEGATIVE
Spec Grav, UA: 1.01
Urobilinogen, UA: 1
pH, UA: 5.5

## 2012-06-11 LAB — BASIC METABOLIC PANEL
BUN: 12 mg/dL (ref 6–23)
CO2: 27 mEq/L (ref 19–32)
Calcium: 8.8 mg/dL (ref 8.4–10.5)
Chloride: 106 mEq/L (ref 96–112)
Creatinine, Ser: 1 mg/dL (ref 0.4–1.5)
GFR: 104.07 mL/min (ref >60.00)
Glucose, Bld: 94 mg/dL (ref 70–99)
Potassium: 3.9 mEq/L (ref 3.5–5.1)
Sodium: 139 mEq/L (ref 135–145)

## 2012-06-11 LAB — LIPID PANEL
Cholesterol: 165 mg/dL (ref 0–200)
HDL: 46.5 mg/dL (ref >39.00)
LDL Cholesterol: 109 mg/dL — ABNORMAL HIGH (ref 0–99)
Total CHOL/HDL Ratio: 4
Triglycerides: 47 mg/dL (ref 0.0–149.0)
VLDL: 9.4 mg/dL (ref 0.0–40.0)

## 2012-06-11 LAB — PSA: PSA: 0.56 ng/mL (ref 0.10–4.00)

## 2012-06-11 LAB — TSH: TSH: 0.98 u[IU]/mL (ref 0.35–5.50)

## 2012-06-18 ENCOUNTER — Encounter: Payer: Self-pay | Admitting: Family Medicine

## 2012-06-18 ENCOUNTER — Ambulatory Visit (INDEPENDENT_AMBULATORY_CARE_PROVIDER_SITE_OTHER): Payer: BC Managed Care – PPO | Admitting: Family Medicine

## 2012-06-18 VITALS — BP 160/104 | Temp 97.7°F | Ht 68.75 in | Wt 188.0 lb

## 2012-06-18 DIAGNOSIS — R319 Hematuria, unspecified: Secondary | ICD-10-CM

## 2012-06-18 DIAGNOSIS — Z23 Encounter for immunization: Secondary | ICD-10-CM

## 2012-06-18 DIAGNOSIS — J309 Allergic rhinitis, unspecified: Secondary | ICD-10-CM

## 2012-06-18 DIAGNOSIS — I10 Essential (primary) hypertension: Secondary | ICD-10-CM

## 2012-06-18 LAB — POCT URINALYSIS DIPSTICK
Bilirubin, UA: NEGATIVE
Glucose, UA: NEGATIVE
Ketones, UA: NEGATIVE
Leukocytes, UA: NEGATIVE
Nitrite, UA: NEGATIVE
Protein, UA: NEGATIVE
Spec Grav, UA: 1.01
Urobilinogen, UA: 0.2
pH, UA: 6

## 2012-06-18 MED ORDER — LOSARTAN POTASSIUM-HCTZ 100-25 MG PO TABS: 1.0000 | ORAL_TABLET | Freq: Every day | ORAL | Status: DC

## 2012-06-18 NOTE — Patient Instructions (Signed)
Take your blood pressure medication daily in the morning   Check your blood pressure daily for the next 4 weeks  Blood pressure goal is 135/85 or less  If your blood pressure is normal then going forward just check it weekly  If your blood pressures not normal after 4 weeks return with the data and the device for followup here

## 2012-06-18 NOTE — Progress Notes (Signed)
  Subjective:    Patient ID: Phillip Scott, male    DOB: 1961-05-28, 51 y.o.   MRN: [id number]  HPI Phillip Scott is a 51 year old now single male nonsmoker who comes in today for general physical examination because of a history of hypertension  He has been on Hyzaar 100-25 daily for hypertension however he skipped a few doses last week. BP today 160/104.  He no longer needs the tramadol Viagra or Valium  Recommend he get routine eye care yearly, regular dental care, and a screening colonoscopy this year, tetanus 2010 seasonal flu shot today   Review of Systems  Constitutional: Negative.   HENT: Negative.   Eyes: Negative.   Respiratory: Negative.   Cardiovascular: Negative.   Gastrointestinal: Negative.   Genitourinary: Negative.   Musculoskeletal: Negative.   Skin: Negative.   Neurological: Negative.   Hematological: Negative.   Psychiatric/Behavioral: Negative.        Objective:   Physical Exam  Constitutional: He is oriented to person, place, and time. He appears well-developed and well-nourished.  HENT:  Head: Normocephalic and atraumatic.  Right Ear: External ear normal.  Left Ear: External ear normal.  Nose: Nose normal.  Mouth/Throat: Oropharynx is clear and moist.  Eyes: Conjunctivae normal and EOM are normal. Pupils are equal, round, and reactive to light.  Neck: Normal range of motion. Neck supple. No JVD present. No tracheal deviation present. No thyromegaly present.  Cardiovascular: Normal rate, regular rhythm, normal heart sounds and intact distal pulses.  Exam reveals no gallop and no friction rub.   No murmur heard. Pulmonary/Chest: Effort normal and breath sounds normal. No stridor. No respiratory distress. He has no wheezes. He has no rales. He exhibits no tenderness.  Abdominal: Soft. Bowel sounds are normal. He exhibits no distension and no mass. There is no tenderness. There is no rebound and no guarding.  Genitourinary: Rectum normal, prostate normal and  penis normal. Guaiac negative stool. No penile tenderness.  Musculoskeletal: Normal range of motion. He exhibits no edema and no tenderness.  Lymphadenopathy:    He has no cervical adenopathy.  Neurological: He is alert and oriented to person, place, and time. He has normal reflexes. No cranial nerve deficit. He exhibits normal muscle tone.  Skin: Skin is warm and dry. No rash noted. No erythema. No pallor.       Skin exam normal except for scar  Psychiatric: He has a normal mood and affect. His behavior is normal. Judgment and thought content normal.          Assessment & Plan:  Healthy male  Hypertension continue current medications check BP daily for 4 weeks to be sure blood pressure drops back to normal  Asymptomatic hematuria refer to urology for evaluation  History of kidney stone surgery when he was a child scar left flank

## 2012-09-15 ENCOUNTER — Other Ambulatory Visit: Payer: Self-pay | Admitting: Family Medicine

## 2013-04-09 ENCOUNTER — Telehealth: Payer: Self-pay | Admitting: Family Medicine

## 2013-04-09 DIAGNOSIS — G4730 Sleep apnea, unspecified: Secondary | ICD-10-CM

## 2013-04-09 NOTE — Telephone Encounter (Signed)
PT is calling to request a sleep study. Please advise if okay, or if the pt needs an office visit first. Thank you!

## 2013-04-09 NOTE — Telephone Encounter (Signed)
Okay per Dr Tawanna Cooler and patient is aware. Order placed.

## 2013-05-06 ENCOUNTER — Ambulatory Visit (INDEPENDENT_AMBULATORY_CARE_PROVIDER_SITE_OTHER): Payer: BC Managed Care – PPO | Admitting: Pulmonary Disease

## 2013-05-06 ENCOUNTER — Encounter: Payer: Self-pay | Admitting: Pulmonary Disease

## 2013-05-06 VITALS — BP 162/112 | HR 55 | Temp 97.3°F | Ht 68.5 in | Wt 204.4 lb

## 2013-05-06 DIAGNOSIS — G478 Other sleep disorders: Secondary | ICD-10-CM

## 2013-05-06 NOTE — Assessment & Plan Note (Signed)
The patient is having significant sleep disruption during the night of unknown origin, and this is leading to non-restorative sleep and significant daytime sleepiness.  He does not have a consistent bed partner, and therefore it is hard to know if he is having sleep disordered breathing or a movement disorder of sleep.  He is not significantly overweight, but this does not preclude him from having sleep apnea.  At this point, he is going to need a sleep study for diagnosis, and the patient is agreeable.

## 2013-05-06 NOTE — Progress Notes (Signed)
Subjective:    Patient ID: Phillip Scott, male    DOB: 05-13-1961, 52 y.o.   MRN: [id number]  HPI The patient is a 52 year old male who I've been asked to see for possible sleep apnea.  The patient has had issues with sleep onset for quite some time, but more recently has been having issues with frequent awakenings during the night for unknown reasons.  He typically will get back to sleep very quickly after the awakenings during the night.  He sleeps alone, but thinks he may have a snoring issue.  No one has ever commented on an abnormal breathing pattern during sleep, but he currently sleeps alone.  The patient is unsure if he has tics during the night, and denies classic symptoms of RLS.  He does have a history of sleep walking, but this has not been an issue during adulthood.  The patient awakens early to go to work, and does not feel rested.  He notes significant daytime sleepiness with inactivity, and can actually fall asleep.  He also falls asleep watching television or movies in the evening, and has some sleepiness with driving.  He tells me that his weight is neutral over the last few years, and his Epworth score today is very abnormal at 24.   Sleep Questionnaire What time do you typically go to bed?( Between what hours) 1030 1030 at 1115 on 05/06/13 by Maisie Fus, CMA How long does it take you to fall asleep? few hours few hours at 1115 on 05/06/13 by Maisie Fus, CMA How many times during the night do you wake up? 5 5 at 1115 on 05/06/13 by Maisie Fus, CMA What time do you get out of bed to start your day? [id number] at 1115 on 05/06/13 by Maisie Fus, CMA Do you drive or operate heavy machinery in your occupation? No No at 1115 on 05/06/13 by Maisie Fus, CMA How much has your weight changed (up or down) over the past two years? (In pounds) 10 lb (4.536 kg)10 lb (4.536 kg) increase at 1115 on 05/06/13 by Maisie Fus, CMA Have you ever had a sleep study  before? No No at 1115 on 05/06/13 by Maisie Fus, CMA Do you currently use CPAP? No No at 1115 on 05/06/13 by Maisie Fus, CMA Do you wear oxygen at any time? No No at 1115 on 05/06/13 by Maisie Fus, CMA   Review of Systems  Constitutional: Negative for fever and unexpected weight change.  HENT: Negative for ear pain, nosebleeds, congestion, sore throat, rhinorrhea, sneezing, trouble swallowing, dental problem, postnasal drip and sinus pressure.   Eyes: Negative for redness and itching.  Respiratory: Negative for cough, chest tightness, shortness of breath and wheezing.   Cardiovascular: Negative for palpitations and leg swelling.  Gastrointestinal: Negative for nausea and vomiting.  Genitourinary: Negative for dysuria.  Musculoskeletal: Negative for joint swelling.  Skin: Negative for rash.  Neurological: Negative for headaches.  Hematological: Does not bruise/bleed easily.  Psychiatric/Behavioral: Negative for dysphoric mood. The patient is not nervous/anxious.        Objective:   Physical Exam Constitutional:  Well developed, no acute distress  HENT:  Nares patent without discharge, mild deviation to left  Oropharynx without exudate, palate and uvula are moderately elongated.   Eyes:  Perrla, eomi, no scleral icterus  Neck:  No JVD, no TMG  Cardiovascular:  Normal rate, regular rhythm, no rubs or gallops.  No murmurs  Intact distal pulses  Pulmonary :  Normal breath sounds, no stridor or respiratory distress   No rales, rhonchi, or wheezing  Abdominal:  Soft, nondistended, bowel sounds present.  No tenderness noted.   Musculoskeletal:  No lower extremity edema noted.  Lymph Nodes:  No cervical lymphadenopathy noted  Skin:  No cyanosis noted  Neurologic:  Alert, appropriate, moves all 4 extremities without obvious deficit.         Assessment & Plan:

## 2013-05-06 NOTE — Patient Instructions (Addendum)
Will schedule for a sleep study, and check on insurance copay information. Will arrange followup with me once the results are available. Will give you a flu shot today.

## 2013-05-30 ENCOUNTER — Ambulatory Visit (HOSPITAL_BASED_OUTPATIENT_CLINIC_OR_DEPARTMENT_OTHER): Payer: BC Managed Care – PPO | Attending: Pulmonary Disease

## 2013-05-30 VITALS — Ht 68.5 in | Wt 192.0 lb

## 2013-05-30 DIAGNOSIS — G4733 Obstructive sleep apnea (adult) (pediatric): Secondary | ICD-10-CM | POA: Insufficient documentation

## 2013-05-30 DIAGNOSIS — G478 Other sleep disorders: Secondary | ICD-10-CM

## 2013-06-06 ENCOUNTER — Telehealth: Payer: Self-pay | Admitting: Pulmonary Disease

## 2013-06-06 NOTE — Telephone Encounter (Signed)
Called and spoke with pt and he is aware that we will send message to [facility] for the sleep results and that we will call pt once these have been reviewed.  KC please advise. Thanks  Allergies  Allergen Reactions  . Lisinopril     REACTION: cough

## 2013-06-11 ENCOUNTER — Telehealth: Payer: Self-pay | Admitting: Pulmonary Disease

## 2013-06-11 DIAGNOSIS — G4733 Obstructive sleep apnea (adult) (pediatric): Secondary | ICD-10-CM

## 2013-06-11 DIAGNOSIS — G478 Other sleep disorders: Secondary | ICD-10-CM

## 2013-06-11 NOTE — Telephone Encounter (Signed)
Pt is calling again for sleep study results. Please advise if these have been received. Thanks. Carron Curie, CMA

## 2013-06-11 NOTE — Telephone Encounter (Signed)
This has been read.

## 2013-06-11 NOTE — Telephone Encounter (Signed)
Pt scheduled to review sleep study 06/13/13 at 11:45 with [facility]

## 2013-06-13 ENCOUNTER — Ambulatory Visit (INDEPENDENT_AMBULATORY_CARE_PROVIDER_SITE_OTHER): Payer: BC Managed Care – PPO | Admitting: Pulmonary Disease

## 2013-06-13 ENCOUNTER — Encounter: Payer: Self-pay | Admitting: Pulmonary Disease

## 2013-06-13 VITALS — BP 142/98 | HR 82 | Temp 97.7°F | Ht 68.5 in | Wt 203.0 lb

## 2013-06-13 DIAGNOSIS — G4733 Obstructive sleep apnea (adult) (pediatric): Secondary | ICD-10-CM

## 2013-06-13 NOTE — Progress Notes (Signed)
  Subjective:    Patient ID: Phillip Scott, male    DOB: 08-Jul-1961, 52 y.o.   MRN: [id number]  HPI The patient comes in today for followup of his recent sleep study.  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  I have reviewed this study with him in detail, and answered all of his questions.   Review of Systems  Constitutional: Negative for fever and unexpected weight change.  HENT: Negative for congestion, dental problem, ear pain, nosebleeds, postnasal drip, rhinorrhea, sinus pressure, sneezing, sore throat and trouble swallowing.   Eyes: Negative for redness and itching.  Respiratory: Positive for cough. Negative for chest tightness, shortness of breath and wheezing.   Cardiovascular: Negative for palpitations and leg swelling.  Gastrointestinal: Negative for nausea and vomiting.  Genitourinary: Negative for dysuria.  Musculoskeletal: Negative for joint swelling.  Skin: Negative for rash.  Neurological: Negative for headaches.  Hematological: Does not bruise/bleed easily.  Psychiatric/Behavioral: Negative for dysphoric mood. The patient is not nervous/anxious.        Objective:   Physical Exam Overweight male in no acute distress Nose without purulent discharge noted Neck without lymphadenopathy or thyromegaly Lower extremities without edema, no cyanosis Alert and oriented, moves all 4 extremities.       Assessment & Plan:

## 2013-06-13 NOTE — Assessment & Plan Note (Signed)
The patient has mild obstructive sleep apnea by his recent sleep study, but it is unclear if this is responsible for his frequent awakenings at night and nonrestorative sleep.  I have outlined a conservative approach with a trial of weight loss alone and positional therapy, versus a more aggressive approach with a trial of CPAP or a dental appliance.  After a long discussion, the patient feels this is impacting his quality of life significantly, and is jeopardizing his job.  We have therefore it decided to treat him aggressively with a trial of CPAP, and he will return in 6 weeks for followup.

## 2013-06-13 NOTE — Patient Instructions (Signed)
Will try you on cpap for the next 6 weeks to see if your sleep improves. Work on weight loss followup with me in 6 weeks, but call if having tolerance issues with cpap.

## 2013-06-13 NOTE — Procedures (Signed)
NAMETHARON, BOMAR NO.:  [id number]  MEDICAL RECORD NO.:  [id number]          PATIENT TYPE:  OUT  LOCATION:  SLEEP CENTER                 FACILITY:  [facility]  PHYSICIAN:  Barbaraann Share, MD,FCCPDATE OF BIRTH:  08-27-58  DATE OF STUDY:  05/30/2013                           NOCTURNAL POLYSOMNOGRAM  REFERRING PHYSICIAN:  Barbaraann Share, MD,FCCP  INDICATION FOR STUDY:  Hypersomnia with sleep apnea.  EPWORTH SLEEPINESS SCORE:  16.  SLEEP ARCHITECTURE:  The patient had a total sleep time of 363 minutes with no slow-wave sleep and only 65 minutes of REM.  Sleep onset latency was normal at 0.5 minutes and REM onset was normal at 8 minutes.  Sleep efficiency was excellent at 98%.  RESPIRATORY DATA:  The patient was found to have 25 obstructive and central apneas as well as 27 obstructive hypopneas, giving him an apnea- hypopnea index of 9 events per hour.  The events occurred solely in the supine position, and there was loud snoring noted throughout.  OXYGEN DATA:  There was O2 desaturation as low as 87% with the patient's obstructive events.  CARDIAC DATA:  No clinically significant arrhythmias were noted.  MOVEMENT-PARASOMNIA:  The patient had no significant leg jerks or other abnormality seen.  IMPRESSIONS-RECOMMENDATIONS:  Mild obstructive sleep apnea/hypopnea syndrome, with an AHI of 9 events per hour and oxygen desaturation as low as 87%.  Treatment for this degree of sleep apnea can include a trial of modest weight loss alone, upper airway surgery, dental appliance, and also CPAP.  Clinical correlation is suggested.     Barbaraann Share, MD,FCCP Diplomate, American Board of Sleep Medicine    [facility]/MED[facility]  D:  06/11/2013 16:10:96  T:  06/11/2013 04:54:09  Job:  [id number]

## 2013-08-08 ENCOUNTER — Ambulatory Visit: Payer: BC Managed Care – PPO | Admitting: Pulmonary Disease

## 2013-08-18 ENCOUNTER — Ambulatory Visit (INDEPENDENT_AMBULATORY_CARE_PROVIDER_SITE_OTHER): Payer: BC Managed Care – PPO | Admitting: Family Medicine

## 2013-08-18 ENCOUNTER — Encounter: Payer: Self-pay | Admitting: Family Medicine

## 2013-08-18 VITALS — BP 140/90 | Temp 97.7°F | Wt 204.0 lb

## 2013-08-18 DIAGNOSIS — H60399 Other infective otitis externa, unspecified ear: Secondary | ICD-10-CM

## 2013-08-18 DIAGNOSIS — H61899 Other specified disorders of external ear, unspecified ear: Secondary | ICD-10-CM

## 2013-08-18 DIAGNOSIS — L989 Disorder of the skin and subcutaneous tissue, unspecified: Secondary | ICD-10-CM

## 2013-08-18 DIAGNOSIS — H6090 Unspecified otitis externa, unspecified ear: Secondary | ICD-10-CM

## 2013-08-18 DIAGNOSIS — T169XXA Foreign body in ear, unspecified ear, initial encounter: Secondary | ICD-10-CM

## 2013-08-18 MED ORDER — CIPROFLOXACIN-DEXAMETHASONE 0.3-0.1 % OT SUSP: 4.0000 [drp] | Freq: Two times a day (BID) | OTIC | Status: DC

## 2013-08-18 NOTE — Progress Notes (Signed)
Chief Complaint  Patient presents with  . q tip in ear    HPI:  ? q tip in L ear: -lost cotton tip of qtip in ear yesterday -friend was trying to get it out with tweezers and metal pin and bulb suction -a little bleeding -no pain, can hear fine  ROS: See pertinent positives and negatives per HPI.  Past Medical History  Diagnosis Date  . Hypertension   . Allergic rhinitis     Past Surgical History  Procedure Laterality Date  . Knee surgery      x 2     Family History  Problem Relation Age of Onset  . Hypertension Father   . Hypertension Other     multiple family members    History   Social History  . Marital Status: Married    Spouse Name: N/A    Number of Children: N/A  . Years of Education: N/A   Occupational History  . Housekeeper    Social History Main Topics  . Smoking status: Never Smoker   . Smokeless tobacco: None  . Alcohol Use: No  . Drug Use: No  . Sexual Activity: None   Other Topics Concern  . None   Social History Narrative  . None    Current outpatient prescriptions:ciprofloxacin-dexamethasone (CIPRODEX) otic suspension, Place 4 drops into the left ear 2 (two) times daily., Disp: 7.5 mL, Rfl: 0;  losartan-hydrochlorothiazide (HYZAAR) 100-25 MG per tablet, Take 1 tablet by mouth daily., Disp: 90 tablet, Rfl: 3  EXAM:  Filed Vitals:   08/18/13 1016  BP: 140/90  Temp: 97.7 F (36.5 C)    Body mass index is 30.56 kg/(m^2).  GENERAL: vitals reviewed and listed above, alert, oriented, appears well hydrated and in no acute distress  HEENT: atraumatic, conjunttiva clear, no obvious abnormalities on inspection of external nose and ears - on inspection of L ear sig amount of dried blood and cotton tip in distal ear canal - after removal of cotton tip gently with alligator tips - damage to ear can with erythema, mild swelling noted  NECK: no obvious masses on inspection  MS: moves all extremities without noticeable abnormality  PSYCH:  pleasant and cooperative, no obvious depression or anxiety  ASSESSMENT AND PLAN:  Discussed the following assessment and plan:  Lesion of ear canal - Plan: Ambulatory referral to ENT, ciprofloxacin-dexamethasone (CIPRODEX) otic suspension  Foreign body in ear - Plan: Ambulatory referral to ENT  Otitis externa  -cotton tip removed easily and gently without touching ear canal, on removal damage to ear canal noted -this is a result of excessive instrumentation at home by friend -abx drops to prevent/tx infection -referral to ENT for eval damage and follow up -Patient advised to return or notify a doctor immediately if symptoms worsen or persist or new concerns arise.  There are no Patient Instructions on file for this visit.   Kriste BasqueKIM, Zarin Knupp R.

## 2013-08-18 NOTE — Progress Notes (Signed)
Pre visit review using our clinic review tool, if applicable. No additional management support is needed unless otherwise documented below in the visit note. 

## 2013-08-29 ENCOUNTER — Ambulatory Visit: Payer: BC Managed Care – PPO | Admitting: Pulmonary Disease

## 2013-09-28 ENCOUNTER — Other Ambulatory Visit: Payer: Self-pay | Admitting: Family Medicine

## 2014-08-13 ENCOUNTER — Other Ambulatory Visit (INDEPENDENT_AMBULATORY_CARE_PROVIDER_SITE_OTHER): Payer: BC Managed Care – PPO

## 2014-08-13 DIAGNOSIS — Z0000 Encounter for general adult medical examination without abnormal findings: Secondary | ICD-10-CM

## 2014-08-13 LAB — POCT URINALYSIS DIPSTICK
Bilirubin, UA: NEGATIVE
Blood, UA: NEGATIVE
Glucose, UA: NEGATIVE
Ketones, UA: NEGATIVE
Leukocytes, UA: NEGATIVE
Nitrite, UA: NEGATIVE
Spec Grav, UA: 1.02
Urobilinogen, UA: 2
pH, UA: 5.5

## 2014-08-13 LAB — CBC WITH DIFFERENTIAL/PLATELET
Basophils Absolute: 0 10*3/uL (ref 0.0–0.1)
Basophils Relative: 0.5 % (ref 0.0–3.0)
Eosinophils Absolute: 0.4 10*3/uL (ref 0.0–0.7)
Eosinophils Relative: 6.6 % — ABNORMAL HIGH (ref 0.0–5.0)
HCT: 41 % (ref 39.0–52.0)
Hemoglobin: 13.7 g/dL (ref 13.0–17.0)
Lymphocytes Relative: 24.5 % (ref 12.0–46.0)
Lymphs Abs: 1.3 10*3/uL (ref 0.7–4.0)
MCHC: 33.5 g/dL (ref 30.0–36.0)
MCV: 92.8 fl (ref 78.0–100.0)
Monocytes Absolute: 0.6 10*3/uL (ref 0.1–1.0)
Monocytes Relative: 10.7 % (ref 3.0–12.0)
Neutro Abs: 3.1 10*3/uL (ref 1.4–7.7)
Neutrophils Relative %: 57.7 % (ref 43.0–77.0)
Platelets: 169 10*3/uL (ref 150.0–400.0)
RBC: 4.41 Mil/uL (ref 4.22–5.81)
RDW: 13.3 % (ref 11.5–15.5)
WBC: 5.3 10*3/uL (ref 4.0–10.5)

## 2014-08-13 LAB — HEPATIC FUNCTION PANEL
ALT: 15 U/L (ref 0–53)
AST: 19 U/L (ref 0–37)
Albumin: 4.2 g/dL (ref 3.5–5.2)
Alkaline Phosphatase: 57 U/L (ref 39–117)
Bilirubin, Direct: 0.3 mg/dL (ref 0.0–0.3)
Total Bilirubin: 1.6 mg/dL — ABNORMAL HIGH (ref 0.2–1.2)
Total Protein: 7.2 g/dL (ref 6.0–8.3)

## 2014-08-13 LAB — BASIC METABOLIC PANEL
BUN: 17 mg/dL (ref 6–23)
CO2: 30 mEq/L (ref 19–32)
Calcium: 9 mg/dL (ref 8.4–10.5)
Chloride: 101 mEq/L (ref 96–112)
Creatinine, Ser: 1.02 mg/dL (ref 0.40–1.50)
GFR: 97.41 mL/min (ref >60.00)
Glucose, Bld: 105 mg/dL — ABNORMAL HIGH (ref 70–99)
Potassium: 3.8 mEq/L (ref 3.5–5.1)
Sodium: 138 mEq/L (ref 135–145)

## 2014-08-13 LAB — LIPID PANEL
Cholesterol: 174 mg/dL (ref 0–200)
HDL: 40.2 mg/dL (ref >39.00)
LDL Cholesterol: 120 mg/dL — ABNORMAL HIGH (ref 0–99)
NonHDL: 133.8
Total CHOL/HDL Ratio: 4
Triglycerides: 71 mg/dL (ref 0.0–149.0)
VLDL: 14.2 mg/dL (ref 0.0–40.0)

## 2014-08-13 LAB — PSA: PSA: 0.59 ng/mL (ref 0.10–4.00)

## 2014-08-13 LAB — TSH: TSH: 2.26 u[IU]/mL (ref 0.35–4.50)

## 2014-08-13 MED ORDER — LOSARTAN POTASSIUM-HCTZ 100-25 MG PO TABS: 1.0000 | ORAL_TABLET | Freq: Every day | ORAL | Status: DC

## 2014-08-20 ENCOUNTER — Encounter: Payer: Self-pay | Admitting: Family Medicine

## 2014-08-20 ENCOUNTER — Ambulatory Visit (INDEPENDENT_AMBULATORY_CARE_PROVIDER_SITE_OTHER): Payer: BC Managed Care – PPO | Admitting: Family Medicine

## 2014-08-20 VITALS — BP 124/88 | Temp 97.4°F | Ht 68.25 in | Wt 196.6 lb

## 2014-08-20 DIAGNOSIS — Z0000 Encounter for general adult medical examination without abnormal findings: Secondary | ICD-10-CM | POA: Insufficient documentation

## 2014-08-20 DIAGNOSIS — Z23 Encounter for immunization: Secondary | ICD-10-CM

## 2014-08-20 DIAGNOSIS — I10 Essential (primary) hypertension: Secondary | ICD-10-CM

## 2014-08-20 MED ORDER — LOSARTAN POTASSIUM-HCTZ 100-25 MG PO TABS: 1.0000 | ORAL_TABLET | Freq: Every day | ORAL | Status: DC

## 2014-08-20 NOTE — Progress Notes (Signed)
   Subjective:    Patient ID: Phillip Scott, male    DOB: 14-Feb-1961, 54 y.o.   MRN: [id number]  HPI Phillip Scott is a 54 year old male who comes in today for general physical examination  He's a nonsmoker  He takes Hyzaar 100-25 daily for hypertension BP today 124/88  He does not get routine eye care,,, referred to Dr. Hazle Quantigby, does get regular dental care, never had a colonoscopy. We put in a request 2 years ago and nobody ever called him back. We'll try again. GI-wise he's asymptomatic   Review of Systems  Constitutional: Negative.   HENT: Negative.   Eyes: Negative.   Respiratory: Negative.   Cardiovascular: Negative.   Gastrointestinal: Negative.   Endocrine: Negative.   Genitourinary: Negative.   Musculoskeletal: Negative.   Skin: Negative.   Allergic/Immunologic: Negative.   Neurological: Negative.   Hematological: Negative.   Psychiatric/Behavioral: Negative.        Objective:   Physical Exam  Constitutional: He is oriented to person, place, and time. He appears well-developed and well-nourished.  HENT:  Head: Normocephalic and atraumatic.  Right Ear: External ear normal.  Left Ear: External ear normal.  Nose: Nose normal.  Mouth/Throat: Oropharynx is clear and moist.  Eyes: Conjunctivae and EOM are normal. Pupils are equal, round, and reactive to light.  Neck: Normal range of motion. Neck supple. No JVD present. No tracheal deviation present. No thyromegaly present.  Cardiovascular: Normal rate, regular rhythm, normal heart sounds and intact distal pulses.  Exam reveals no gallop and no friction rub.   No murmur heard. Pulmonary/Chest: Effort normal and breath sounds normal. No stridor. No respiratory distress. He has no wheezes. He has no rales. He exhibits no tenderness.  Abdominal: Soft. Bowel sounds are normal. He exhibits no distension and no mass. There is no tenderness. There is no rebound and no guarding.  Genitourinary: Rectum normal, prostate normal and  penis normal. Guaiac negative stool. No penile tenderness.  Musculoskeletal: Normal range of motion. He exhibits no edema or tenderness.  Lymphadenopathy:    He has no cervical adenopathy.  Neurological: He is alert and oriented to person, place, and time. He has normal reflexes. No cranial nerve deficit. He exhibits normal muscle tone.  Skin: Skin is warm and dry. No rash noted. No erythema. No pallor.  Total body skin exam normal except for scar left flank. He states he had a kidney operation when he was a child he does not remember what for. Asked him to ask his father what the problem was and what they did.  Psychiatric: He has a normal mood and affect. His behavior is normal. Judgment and thought content normal.  Nursing note and vitals reviewed.         Assessment & Plan:  Healthy male  Hypertension at goal,,, continue current therapy

## 2014-08-20 NOTE — Progress Notes (Signed)
Pre visit review using our clinic review tool, if applicable. No additional management support is needed unless otherwise documented below in the visit note. 

## 2014-08-20 NOTE — Patient Instructions (Addendum)
Continue good health habits  Hyzaar 100-25,,,,,,,, 1 daily for high blood pressure  Return in one year sooner if any problems  Check with your father to see if he remembers why you had that kidney operation,,,,, if he does remember call and leave a voicemail with Fleet ContrasRachel extension [phone] so we can add it to your medical records  We will also get you set up for a screening colonoscopy and GI. If nobody calls you within 10 days call and leave a voicemail with Fleet Contrasachel

## 2016-04-22 ENCOUNTER — Other Ambulatory Visit: Payer: Self-pay | Admitting: Family Medicine

## 2016-04-24 ENCOUNTER — Ambulatory Visit (INDEPENDENT_AMBULATORY_CARE_PROVIDER_SITE_OTHER): Payer: BC Managed Care – PPO | Admitting: Family Medicine

## 2016-04-24 ENCOUNTER — Encounter: Payer: Self-pay | Admitting: Family Medicine

## 2016-04-24 VITALS — BP 190/110 | HR 83 | Temp 98.1°F | Wt 200.1 lb

## 2016-04-24 DIAGNOSIS — I10 Essential (primary) hypertension: Secondary | ICD-10-CM | POA: Diagnosis not present

## 2016-04-24 LAB — POCT URINALYSIS DIPSTICK
Bilirubin, UA: NEGATIVE
Glucose, UA: NEGATIVE
Ketones, UA: NEGATIVE
Leukocytes, UA: NEGATIVE
Nitrite, UA: NEGATIVE
Spec Grav, UA: 1.02
Urobilinogen, UA: 1
pH, UA: 6.5

## 2016-04-24 MED ORDER — LOSARTAN POTASSIUM-HCTZ 100-25 MG PO TABS: 1.0000 | ORAL_TABLET | Freq: Every day | ORAL | 4 refills | Status: DC

## 2016-04-24 NOTE — Progress Notes (Signed)
Pre visit review using our clinic review tool, if applicable. No additional management support is needed unless otherwise documented below in the visit note. 

## 2016-04-24 NOTE — Progress Notes (Signed)
Fischer is a 55 year old single male nonsmoker who comes in today for evaluation of a spell  He states he was working on Saturday when he notices sudden onset of numbness in his tongue and tingling of his lower extremities. He set down and last about 2 minutes and went away continue to work.  He's been off his blood pressure medication for couple weeks  Review of systems otherwise negative  Physical examination BP when he came in was 236/120. In the supine position after 10 minutes to 20/110. He was given clonidine 0.2 at 4:35 PM. Will monitor his blood pressure every 15 minutes until we see a dropping back to normal  BP check every 15 minutes now BP is dropped to 190/100  Impression hypertension accelerated because of missed medication  Plan.........Marland Kitchen. bedrest at home......Marland Kitchen. restart medication.......... labs today....... follow-up tomorrow at 4 PM

## 2016-04-24 NOTE — Patient Instructions (Addendum)
Hyzaar 100-25........... one tablet now.............Marland Kitchen. 1 tablet at bedtime tonight........... one tablet in the morning  Rest at home all day tomorrow............ you may get up to go to the bathroom and eat otherwise stated complete bed rest  Return here at 4 PM tomorrow for follow-up  Labs now  Purchase a Omron pump up digital blood pressure cuff,,,,,,,,,,,, [employer]  Check your blood pressure daily in the morning

## 2016-04-25 ENCOUNTER — Encounter: Payer: Self-pay | Admitting: Family Medicine

## 2016-04-25 ENCOUNTER — Ambulatory Visit (INDEPENDENT_AMBULATORY_CARE_PROVIDER_SITE_OTHER): Payer: BC Managed Care – PPO | Admitting: Family Medicine

## 2016-04-25 ENCOUNTER — Encounter: Payer: Self-pay | Admitting: Emergency Medicine

## 2016-04-25 VITALS — BP 176/108 | HR 93 | Wt 195.6 lb

## 2016-04-25 DIAGNOSIS — I10 Essential (primary) hypertension: Secondary | ICD-10-CM | POA: Diagnosis not present

## 2016-04-25 LAB — CBC WITH DIFFERENTIAL/PLATELET
Basophils Absolute: 0 10*3/uL (ref 0.0–0.1)
Basophils Relative: 0.5 % (ref 0.0–3.0)
Eosinophils Absolute: 0.2 10*3/uL (ref 0.0–0.7)
Eosinophils Relative: 3.8 % (ref 0.0–5.0)
HCT: 41.2 % (ref 39.0–52.0)
Hemoglobin: 14.2 g/dL (ref 13.0–17.0)
Lymphocytes Relative: 33.2 % (ref 12.0–46.0)
Lymphs Abs: 1.4 10*3/uL (ref 0.7–4.0)
MCHC: 34.4 g/dL (ref 30.0–36.0)
MCV: 91 fl (ref 78.0–100.0)
Monocytes Absolute: 0.2 10*3/uL (ref 0.1–1.0)
Monocytes Relative: 5 % (ref 3.0–12.0)
Neutro Abs: 2.4 10*3/uL (ref 1.4–7.7)
Neutrophils Relative %: 57.5 % (ref 43.0–77.0)
Platelets: 173 10*3/uL (ref 150.0–400.0)
RBC: 4.53 Mil/uL (ref 4.22–5.81)
RDW: 13.8 % (ref 11.5–15.5)
WBC: 4.2 10*3/uL (ref 4.0–10.5)

## 2016-04-25 LAB — BASIC METABOLIC PANEL
BUN: 16 mg/dL (ref 6–23)
CO2: 28 mEq/L (ref 19–32)
Calcium: 8.7 mg/dL (ref 8.4–10.5)
Chloride: 108 mEq/L (ref 96–112)
Creatinine, Ser: 1.29 mg/dL (ref 0.40–1.50)
GFR: 73.83 mL/min (ref >60.00)
Glucose, Bld: 89 mg/dL (ref 70–99)
Potassium: 4 mEq/L (ref 3.5–5.1)
Sodium: 141 mEq/L (ref 135–145)

## 2016-04-25 LAB — TSH: TSH: 1.55 u[IU]/mL (ref 0.35–4.50)

## 2016-04-25 NOTE — Progress Notes (Signed)
Phillip Scott is a 55 year old married male who comes in today for follow-up of hypertension  Is noted we saw him yesterday he had run out of his medication and developed severe hypertension. BP today 176/108 after 3 doses of losartan.  Remains asymptomatic  Lab shows decreased GFR proteinuria and some blunt his urine probably related to nephrotic insult from the severe hypertension.  Vital signs stable set for BP 176/108  Impression hypertension not at goal............ continue bedrest.........Marland Kitchen. medication twice a day...Marland Kitchen.Marland Kitchen.Marland Kitchen. follow-up with Phillip Scott on Thursday afternoon....... if blood pressure normal may go back to work on Friday....... if not normal by Thursday Phillip KeenCory will evaluate his medication..... And see me on the following Monday for follow-up

## 2016-04-25 NOTE — Patient Instructions (Signed)
Your blood pressure medicine.......... take one in the morning and at 1 in the evening  Stay at bedrest at home ,,,,,,,,,,,, the rest of today and all day tomorrow  Return in the afternoon to see Tawana Scaleorey nafzinger for blood pressure follow-up.  If your blood pressure is normal he will likely go back to work on Friday if not normal he will reevaluate your medication and have you see me in follow-up the following Monday

## 2016-04-25 NOTE — Progress Notes (Signed)
Pre visit review using our clinic review tool, if applicable. No additional management support is needed unless otherwise documented below in the visit note. 

## 2016-04-26 ENCOUNTER — Encounter (HOSPITAL_COMMUNITY): Payer: Self-pay | Admitting: Emergency Medicine

## 2016-04-26 ENCOUNTER — Emergency Department (HOSPITAL_COMMUNITY): Payer: BC Managed Care – PPO

## 2016-04-26 ENCOUNTER — Emergency Department (HOSPITAL_COMMUNITY)
Admission: EM | Admit: 2016-04-26 | Discharge: 2016-04-26 | Disposition: A | Discharge status: Home / Self Care | Payer: BC Managed Care – PPO | Attending: Emergency Medicine | Admitting: Emergency Medicine

## 2016-04-26 DIAGNOSIS — Z79899 Other long term (current) drug therapy: Secondary | ICD-10-CM | POA: Diagnosis not present

## 2016-04-26 DIAGNOSIS — R55 Syncope and collapse: Secondary | ICD-10-CM | POA: Diagnosis not present

## 2016-04-26 DIAGNOSIS — I10 Essential (primary) hypertension: Secondary | ICD-10-CM | POA: Insufficient documentation

## 2016-04-26 LAB — BASIC METABOLIC PANEL
Anion gap: 6 (ref 5–15)
BUN: 22 mg/dL — ABNORMAL HIGH (ref 6–20)
CO2: 27 mmol/L (ref 22–32)
Calcium: 9 mg/dL (ref 8.9–10.3)
Chloride: 100 mmol/L — ABNORMAL LOW (ref 101–111)
Creatinine, Ser: 1.31 mg/dL — ABNORMAL HIGH (ref 0.61–1.24)
GFR calc Af Amer: >60 mL/min (ref >60)
GFR calc non Af Amer: 59 mL/min — ABNORMAL LOW (ref >60)
Glucose, Bld: 213 mg/dL — ABNORMAL HIGH (ref 65–99)
Potassium: 3.8 mmol/L (ref 3.5–5.1)
Sodium: 133 mmol/L — ABNORMAL LOW (ref 135–145)

## 2016-04-26 LAB — CBC WITH DIFFERENTIAL/PLATELET
Basophils Absolute: 0 10*3/uL (ref 0.0–0.1)
Basophils Relative: 0 %
Eosinophils Absolute: 0.1 10*3/uL (ref 0.0–0.7)
Eosinophils Relative: 2 %
HCT: 44.5 % (ref 39.0–52.0)
Hemoglobin: 15.6 g/dL (ref 13.0–17.0)
Lymphocytes Relative: 32 %
Lymphs Abs: 1 10*3/uL (ref 0.7–4.0)
MCH: 31.3 pg (ref 26.0–34.0)
MCHC: 35.1 g/dL (ref 30.0–36.0)
MCV: 89.2 fL (ref 78.0–100.0)
Monocytes Absolute: 0.2 10*3/uL (ref 0.1–1.0)
Monocytes Relative: 5 %
Neutro Abs: 2 10*3/uL (ref 1.7–7.7)
Neutrophils Relative %: 61 %
Platelets: 160 10*3/uL (ref 150–400)
RBC: 4.99 MIL/uL (ref 4.22–5.81)
RDW: 12.8 % (ref 11.5–15.5)
WBC: 3.2 10*3/uL — ABNORMAL LOW (ref 4.0–10.5)

## 2016-04-26 LAB — I-STAT TROPONIN, ED: Troponin i, poc: 0.01 ng/mL (ref 0.00–0.08)

## 2016-04-26 NOTE — ED Notes (Signed)
Patient & O x4.  Patient is ambulatory.

## 2016-04-26 NOTE — ED Triage Notes (Signed)
Per EMS, patient was eating and passed out after eating.  His MD revised his medication for blood pressure.  Patient states he did not fall.  Denies any trauma or injury.   Pressure was less than 90 sitting.  Flat pressure was 130/80.  He is A & O.  Denies any pain but just tired.   EMS administered 150 ml of NS  CBG:157  BP:155/70 P:70 94-95% O2 on room air

## 2016-04-26 NOTE — Discharge Instructions (Signed)
Please take your blood pressure medicine once daily. Do not take it twice a day. I suspect your symptoms today are related to taking too much blood pressure medicine too quickly. Please follow-up with your primary care provider tomorrow as discussed.

## 2016-04-26 NOTE — ED Provider Notes (Signed)
[facility]-EMERGENCY DEPT Provider Note   CSN: [id number] Arrival date & time: 04/26/16  1052     History   Chief Complaint Chief Complaint  Patient presents with  . Loss of Consciousness    HPI Phillip Scott is a 55 y.o. male.  Phillip Scott is a 55 y.o. Male who presents to the emergency department after a syncopal episode while breakfast today. Patient reports he is eating breakfast and began to feel nauseated and lightheaded. He reports he then stood up and had a syncopal episode falling onto his chair on his shoulder. He denies hitting his head. His friends were there to help him to the ground. No seizure-like activity. He reports after he got back up he then vomited once. He reports he is feeling better currently. He tells me he was recently restarted on blood pressure medication. He reports he was out of his hydrochlorothiazide for more than 3 months and it was restarted again 3 days ago. He reports he's been taking losartan-hydrochlorothiazide 100-25 mg twice a day over the past three days. He denies having any chest pain or shortness of breath. He denies feeling lightheaded on position change until sitting at breakfast today. EMS reports that his systolic blood pressure was 90 on arrival. Currently the patient reports he is feeling better. Patient denies fevers, abdominal pain, diarrhea, chest pain, shortness of breath, palpitations, headache, numbness, tingling, weakness or rashes.   The history is provided by the patient. No language interpreter was used.  Loss of Consciousness   Associated symptoms include light-headedness. Pertinent negatives include abdominal pain, back pain, chest pain, congestion, dizziness, fever, headaches, nausea, palpitations, seizures, vomiting and weakness.    Past Medical History:  Diagnosis Date  . Allergic rhinitis   . Hypertension     Patient Active Problem List   Diagnosis Date Noted  . Routine general medical examination at a health care  facility 08/20/2014  . OSA (obstructive sleep apnea) 06/13/2013  . Hematuria, unspecified 06/18/2012  . ALLERGIC RHINITIS 11/05/2008  . Essential hypertension 04/24/2007    Past Surgical History:  Procedure Laterality Date  . KNEE SURGERY     x 2        Home Medications    Prior to Admission medications   Medication Sig Start Date End Date Taking? Authorizing Provider  losartan-hydrochlorothiazide (HYZAAR) 100-25 MG tablet Take 1 tablet by mouth daily. 04/24/16  Yes Roderick PeeJeffrey A Todd, MD    Family History Family History  Problem Relation Age of Onset  . Hypertension Father   . Hypertension Other     multiple family members    Social History Social History  Substance Use Topics  . Smoking status: Never Smoker  . Smokeless tobacco: Never Used  . Alcohol use No     Allergies   Lisinopril   Review of Systems Review of Systems  Constitutional: Negative for chills and fever.  HENT: Negative for congestion and ear pain.   Eyes: Negative for visual disturbance.  Respiratory: Negative for cough and shortness of breath.   Cardiovascular: Positive for syncope. Negative for chest pain, palpitations and leg swelling.  Gastrointestinal: Negative for abdominal pain, diarrhea, nausea and vomiting.  Genitourinary: Negative for dysuria.  Musculoskeletal: Negative for back pain and neck pain.  Skin: Negative for rash.  Neurological: Positive for syncope and light-headedness. Negative for dizziness, seizures, speech difficulty, weakness, numbness and headaches.     Physical Exam Updated Vital Signs BP (!) 168/105 (BP Location: Left Arm)   Pulse  86   Temp 98 F (36.7 C) (Oral)   Resp 14   Ht 5' 8.5" (1.74 m)   Wt 88.5 kg   SpO2 94%   BMI 29.22 kg/m   Physical Exam  Constitutional: He is oriented to person, place, and time. He appears well-developed and well-nourished. No distress.  Nontoxic appearing.  HENT:  Head: Normocephalic and atraumatic.  Right Ear: External  ear normal.  Left Ear: External ear normal.  Mouth/Throat: Oropharynx is clear and moist.  No visible signs of head trauma.  Eyes: Conjunctivae and EOM are normal. Pupils are equal, round, and reactive to light. Right eye exhibits no discharge. Left eye exhibits no discharge.  Neck: Normal range of motion. Neck supple. No JVD present.  Cardiovascular: Normal rate, regular rhythm, normal heart sounds and intact distal pulses.  Exam reveals no gallop and no friction rub.   No murmur heard. Pulmonary/Chest: Effort normal and breath sounds normal. No stridor. No respiratory distress. He has no wheezes. He has no rales.  Lungs clear to auscultation bilaterally. Symmetric chest expansion bilaterally.  Abdominal: Soft. There is no tenderness. There is no guarding.  Abdomen is soft and nontender to palpation.  Musculoskeletal: Normal range of motion. He exhibits no edema or tenderness.  No lower extremity edema or tenderness.  Lymphadenopathy:    He has no cervical adenopathy.  Neurological: He is alert and oriented to person, place, and time. No cranial nerve deficit. Coordination normal.  Patient is alert and oriented 3. Cranial nerves are intact. Speech is clear and coherent. No pronator drift. Finger-to-nose is intact bilaterally. EOMs are intact. Vision is grossly intact. Sensation is intact his bilateral upper and lower extremities.  Skin: Skin is warm and dry. Capillary refill takes less than 2 seconds. No rash noted. He is not diaphoretic. No erythema. No pallor.  Psychiatric: He has a normal mood and affect. His behavior is normal.  Nursing note and vitals reviewed.    ED Treatments / Results  Labs (all labs ordered are listed, but only abnormal results are displayed) Labs Reviewed  CBC WITH DIFFERENTIAL/PLATELET - Abnormal; Notable for the following:       Result Value   WBC 3.2 (*)    All other components within normal limits  BASIC METABOLIC PANEL - Abnormal; Notable for the  following:    Sodium 133 (*)    Chloride 100 (*)    Glucose, Bld 213 (*)    BUN 22 (*)    Creatinine, Ser 1.31 (*)    GFR calc non Af Amer 59 (*)    All other components within normal limits  I-STAT TROPOININ, ED    EKG  EKG Interpretation  Date/Time:  Wednesday April 26 2016 11:03:11 EDT Ventricular Rate:  66 PR Interval:    QRS Duration: 92 QT Interval:  434 QTC Calculation: 455 R Axis:   -162 Text Interpretation:  Right and left arm electrode reversal, interpretation assumes no reversal Sinus or ectopic atrial rhythm Right axis deviation Abnormal R-wave progression, early transition Nonspecific T abnormalities, lateral leads Minimal ST elevation, anterior leads Abnormal ekg Confirmed by Gerhard Munch  MD [phone]) on 04/26/2016 12:44:46 PM       Radiology Dg Chest 2 View  Result Date: 04/26/2016 CLINICAL DATA:  Syncope. EXAM: CHEST  2 VIEW COMPARISON:  01/01/2007. FINDINGS: Trachea is midline. Heart size normal. Lungs are clear. No pleural fluid. IMPRESSION: No acute findings. Electronically Signed   By: Leanna Battles M.D.   On: 04/26/2016  12:08    Procedures Procedures (including critical care time)  Medications Ordered in ED Medications - No data to display   Initial Impression / Assessment and Plan / ED Course  I have reviewed the triage vital signs and the nursing notes.  Pertinent labs & imaging results that were available during my care of the patient were reviewed by me and considered in my medical decision making (see chart for details).  Clinical Course   This is a 55 y.o. Male who presents to the emergency department after a syncopal episode while breakfast today. Patient reports he is eating breakfast and began to feel nauseated and lightheaded. He reports he then stood up and had a syncopal episode falling onto his chair on his shoulder. He denies hitting his head. His friends were there to help him to the ground. No seizure-like activity. He reports  after he got back up he then vomited once. He reports he is feeling better currently. He tells me he was recently restarted on blood pressure medication. He reports he was out of his hydrochlorothiazide for more than 3 months and it was restarted again 3 days ago. He reports he's been taking losartan-hydrochlorothiazide 100-25 mg twice a day over the past three days. He denies having any chest pain or shortness of breath. He denies feeling lightheaded on position change until sitting at breakfast today. EMS reports that his systolic blood pressure was 90 on arrival. Currently the patient reports he is feeling better. On exam patient is afebrile nontoxic appearing. He has no focal neurological deficits. He is able to handle it with normal gait. He is not orthostatic. Troponin is not elevated. CBC is 1.4 white count of 3200. Patient has a history of leukopenia. BMP shows a potassium of 3.2. Creatinine is 1.31 which is around his previous. Chest x-ray is unremarkable. EKG showed some new T-wave inversions, but patient denied any chest pain or shortness of breath. His troponin is not elevated. I doubt ACS. Patient is feeling better at recheck. He is able to ambulate with normal gait without feeling lightheaded. I suspect this patient's symptoms are related to his rapid increase in his blood pressure medicine over the past 3 days, after not taking for 3 months.  I encouraged him to take the blood pressure medicine only once a day. He has follow-up with his primary care doctor tomorrow. I encouraged him to keep this appointment. I discussed strict and specific return precautions.  I advised the patient to follow-up with their primary care provider this week. I advised the patient to return to the emergency department with new or worsening symptoms or new concerns. The patient verbalized understanding and agreement with plan.    This patient was discussed with Dr. Jeraldine Loots who agrees with assessment and plan.     Final Clinical Impressions(s) / ED Diagnoses   Final diagnoses:  Syncope and collapse    New Prescriptions Discharge Medication List as of 04/26/2016 12:56 PM       Everlene Farrier, PA-C 04/26/16 1324    Gerhard Munch, MD 04/26/16 [phone]

## 2016-04-27 ENCOUNTER — Ambulatory Visit: Payer: BC Managed Care – PPO

## 2016-04-27 VITALS — BP 100/80

## 2016-04-27 NOTE — Progress Notes (Signed)
BP Nurse Visit

## 2016-08-31 ENCOUNTER — Other Ambulatory Visit: Payer: Self-pay | Admitting: Family Medicine

## 2016-09-04 ENCOUNTER — Other Ambulatory Visit: Payer: Self-pay | Admitting: Emergency Medicine

## 2017-08-31 ENCOUNTER — Ambulatory Visit: Payer: BC Managed Care – PPO | Admitting: Adult Health

## 2017-08-31 DIAGNOSIS — Z0289 Encounter for other administrative examinations: Secondary | ICD-10-CM

## 2017-10-18 ENCOUNTER — Telehealth: Payer: Self-pay

## 2017-10-18 NOTE — Telephone Encounter (Signed)
Called pt left a message to return my call and schedule for a CPE, pt was last seen on 04/25/2016.

## 2017-11-15 LAB — HM COLONOSCOPY

## 2017-11-19 ENCOUNTER — Ambulatory Visit: Payer: BC Managed Care – PPO | Admitting: Family Medicine

## 2017-11-27 ENCOUNTER — Ambulatory Visit: Payer: BC Managed Care – PPO | Admitting: Family Medicine

## 2017-11-28 ENCOUNTER — Encounter: Payer: Self-pay | Admitting: Family Medicine

## 2017-11-28 ENCOUNTER — Ambulatory Visit: Payer: BC Managed Care – PPO | Admitting: Family Medicine

## 2017-11-28 VITALS — BP 130/90 | HR 70 | Temp 98.0°F | Ht 68.0 in | Wt 203.8 lb

## 2017-11-28 DIAGNOSIS — R319 Hematuria, unspecified: Secondary | ICD-10-CM

## 2017-11-28 DIAGNOSIS — Z125 Encounter for screening for malignant neoplasm of prostate: Secondary | ICD-10-CM

## 2017-11-28 DIAGNOSIS — I10 Essential (primary) hypertension: Secondary | ICD-10-CM

## 2017-11-28 DIAGNOSIS — Z0000 Encounter for general adult medical examination without abnormal findings: Secondary | ICD-10-CM

## 2017-11-28 LAB — BASIC METABOLIC PANEL
BUN: 16 mg/dL (ref 6–23)
CO2: 31 mEq/L (ref 19–32)
Calcium: 9.4 mg/dL (ref 8.4–10.5)
Chloride: 99 mEq/L (ref 96–112)
Creatinine, Ser: 1.14 mg/dL (ref 0.40–1.50)
GFR: 84.68 mL/min (ref >60.00)
Glucose, Bld: 94 mg/dL (ref 70–99)
Potassium: 3.5 mEq/L (ref 3.5–5.1)
Sodium: 137 mEq/L (ref 135–145)

## 2017-11-28 LAB — POCT URINALYSIS DIPSTICK
Bilirubin, UA: NEGATIVE
Blood, UA: NEGATIVE
Glucose, UA: NEGATIVE
Ketones, UA: NEGATIVE
Leukocytes, UA: NEGATIVE
Nitrite, UA: NEGATIVE
Protein, UA: NEGATIVE
Spec Grav, UA: 1.02 (ref 1.010–1.025)
Urobilinogen, UA: 0.2 E.U./dL
pH, UA: 6 (ref 5.0–8.0)

## 2017-11-28 LAB — HEPATIC FUNCTION PANEL
ALT: 19 U/L (ref 0–53)
AST: 19 U/L (ref 0–37)
Albumin: 4.2 g/dL (ref 3.5–5.2)
Alkaline Phosphatase: 42 U/L (ref 39–117)
Bilirubin, Direct: 0.3 mg/dL (ref 0.0–0.3)
Total Bilirubin: 1.4 mg/dL — ABNORMAL HIGH (ref 0.2–1.2)
Total Protein: 7.1 g/dL (ref 6.0–8.3)

## 2017-11-28 LAB — CBC WITH DIFFERENTIAL/PLATELET
Basophils Absolute: 0 10*3/uL (ref 0.0–0.1)
Basophils Relative: 0.8 % (ref 0.0–3.0)
Eosinophils Absolute: 0.2 10*3/uL (ref 0.0–0.7)
Eosinophils Relative: 5.9 % — ABNORMAL HIGH (ref 0.0–5.0)
HCT: 40.6 % (ref 39.0–52.0)
Hemoglobin: 13.7 g/dL (ref 13.0–17.0)
Lymphocytes Relative: 45.1 % (ref 12.0–46.0)
Lymphs Abs: 1.3 10*3/uL (ref 0.7–4.0)
MCHC: 33.9 g/dL (ref 30.0–36.0)
MCV: 92.1 fl (ref 78.0–100.0)
Monocytes Absolute: 0.2 10*3/uL (ref 0.1–1.0)
Monocytes Relative: 7.9 % (ref 3.0–12.0)
Neutro Abs: 1.1 10*3/uL — ABNORMAL LOW (ref 1.4–7.7)
Neutrophils Relative %: 40.3 % — ABNORMAL LOW (ref 43.0–77.0)
Platelets: 190 10*3/uL (ref 150.0–400.0)
RBC: 4.4 Mil/uL (ref 4.22–5.81)
RDW: 13.9 % (ref 11.5–15.5)
WBC: 2.8 10*3/uL — ABNORMAL LOW (ref 4.0–10.5)

## 2017-11-28 LAB — LIPID PANEL
Cholesterol: 166 mg/dL (ref 0–200)
HDL: 39.1 mg/dL (ref >39.00)
LDL Cholesterol: 113 mg/dL — ABNORMAL HIGH (ref 0–99)
NonHDL: 127.07
Total CHOL/HDL Ratio: 4
Triglycerides: 70 mg/dL (ref 0.0–149.0)
VLDL: 14 mg/dL (ref 0.0–40.0)

## 2017-11-28 LAB — TSH: TSH: 1.3 u[IU]/mL (ref 0.35–4.50)

## 2017-11-28 LAB — PSA: PSA: 0.77 ng/mL (ref 0.10–4.00)

## 2017-11-28 NOTE — Progress Notes (Signed)
Phillip Scott is a 57 year old single male nonsmoker who works at [employer] in [employer]eron[employer] who comes in today for annual physical examination because of a history of hypertension  His blood pressure here today is 130/90. That's consistent with what he is getting at home. He's on losartan 100 mg daily along with 25 mg of hydrochlorothiazide.  Asked them to monitor his blood pressure at home for 2 weeks and if blood pressure still not at goal to let us know we will adjust his medication  He does not get routine eye care. Recommend Dr. Emily Filbert. He does get regular dental care. Colonoscopy was done this year and it was normal except for couple polyps. He was told to come back in 5 years.  Vaccinations up-to-date tetanus booster next year March 2020.  Information given on the shingles vaccine  14 point review of systems reviewed and otherwise negative  EKG was done because a history of hypertension. EKG was normal BP 130/90   Pulse 70   Temp 98 F (36.7 C)   Ht  (1.727 m)   Wt 203 lb 12.8 oz (92.4 kg)   BMI 30.99 kg/m   Well-developed well-nourished male no acute distress vital signs stable he is afebrile. HEENT were negative neck was supple thyroid not enlarged no carotid bruits. Cardiopulmonary exam normal. Abdominal exam normal. Genitalia normal circumcised male. Rectal normal stool guaiac-negative prostate normal. Extremities normal skin normal peripheral pulses normal except for scar medial portion right knee from previous knee surgery  #1 hypertension question goal............ BP check morning and evening at home follow-up in 2 weeks BP not normal.

## 2017-11-28 NOTE — Patient Instructions (Signed)
Labs today........... I will call if is anything abnormal  Check your blood pressure in the morning and evening for 2 weeks. BP goal 135/85 or less. If BP not at goal then call and we will readjust her medication  Return in one year for general physical exam sooner if any problems  Call your insurance company and find out where you can get the new shingles vaccine.

## 2018-05-29 ENCOUNTER — Ambulatory Visit: Payer: BC Managed Care – PPO | Admitting: Family Medicine

## 2018-05-29 ENCOUNTER — Encounter: Payer: Self-pay | Admitting: Family Medicine

## 2018-05-29 VITALS — BP 130/80 | HR 90 | Temp 98.3°F | Resp 12 | Ht 68.25 in | Wt 201.2 lb

## 2018-05-29 DIAGNOSIS — R05 Cough: Secondary | ICD-10-CM | POA: Diagnosis not present

## 2018-05-29 DIAGNOSIS — J309 Allergic rhinitis, unspecified: Secondary | ICD-10-CM | POA: Diagnosis not present

## 2018-05-29 DIAGNOSIS — J069 Acute upper respiratory infection, unspecified: Secondary | ICD-10-CM

## 2018-05-29 DIAGNOSIS — R059 Cough, unspecified: Secondary | ICD-10-CM

## 2018-05-29 MED ORDER — FLUTICASONE PROPIONATE 50 MCG/ACT NA SUSP: 1.0000 | Freq: Two times a day (BID) | NASAL | 0 refills | Status: DC

## 2018-05-29 MED ORDER — HYDROCODONE-HOMATROPINE 5-1.5 MG/5ML PO SYRP: 5.0000 mL | ORAL_SOLUTION | Freq: Every evening | ORAL | 0 refills | Status: AC | PRN

## 2018-05-29 MED ORDER — BENZONATATE 100 MG PO CAPS
200.0000 mg | ORAL_CAPSULE | Freq: Two times a day (BID) | ORAL | 0 refills | Status: AC | PRN
Start: 2018-05-29 — End: 2018-06-08

## 2018-05-29 NOTE — Progress Notes (Signed)
ACUTE VISIT  HPI:  Chief Complaint  Patient presents with  . Cough    PhillipPhillip Scott is a 57 y.o.male here today complaining of 6 days of respiratory symptoms. Initially he has "a little" of fever (subjective), chills, body aches, and sore throat.  All the symptoms have resolved. He is also reporting intermittent wheezing, he denies history of asthma or COPD.  No history of tobacco use. He is having known productive cough, postnasal drainage, rhinorrhea, and nasal congestion. Sometimes he has difficulty breathing through his nose. Cough seems to be worse at night when lying down, interfering with his sleep. He denies GERD-like symptoms.  Cough  This is a new problem. The current episode started in the past 7 days. The problem has been gradually improving. The cough is non-productive. Associated symptoms include nasal congestion, postnasal drip, rhinorrhea and wheezing. Pertinent negatives include no chest pain, ear pain, eye redness, headaches, heartburn, hemoptysis, myalgias, rash, sore throat or shortness of breath. The symptoms are aggravated by lying down. The treatment provided mild relief. His past medical history is significant for environmental allergies. There is no history of asthma or COPD.   No Hx of recent travel. No sick contact. No known insect bite.  Hx of allergies: History of allergy rhinitis, he is not on treatment.   OTC medications for this problem: TheraFlu.  Review of Systems  Constitutional: Negative for activity change, appetite change and fatigue.  HENT: Positive for congestion, postnasal drip and rhinorrhea. Negative for ear pain, mouth sores, sinus pain, sore throat, trouble swallowing and voice change.   Eyes: Negative for discharge and redness.  Respiratory: Positive for cough and wheezing. Negative for hemoptysis, chest tightness and shortness of breath.   Cardiovascular: Negative for chest pain.  Gastrointestinal: Negative for  abdominal pain, diarrhea, heartburn, nausea and vomiting.  Musculoskeletal: Negative for gait problem and myalgias.  Skin: Negative for rash.  Allergic/Immunologic: Positive for environmental allergies.  Neurological: Negative for weakness and headaches.      Current Outpatient Medications on File Prior to Visit  Medication Sig Dispense Refill  . losartan-hydrochlorothiazide (HYZAAR) 100-25 MG tablet Take 1 tablet by mouth daily. 100 tablet 4   No current facility-administered medications on file prior to visit.      Past Medical History:  Diagnosis Date  . Allergic rhinitis   . Hypertension    Allergies  Allergen Reactions  . Lisinopril     REACTION: cough    Social History   Socioeconomic History  . Marital status: Married    Spouse name: Not on file  . Number of children: Not on file  . Years of education: Not on file  . Highest education level: Not on file  Occupational History  . Occupation: Advertising copywriter  Social Needs  . Financial resource strain: Not on file  . Food insecurity:    Worry: Not on file    Inability: Not on file  . Transportation needs:    Medical: Not on file    Non-medical: Not on file  Tobacco Use  . Smoking status: Never Smoker  . Smokeless tobacco: Never Used  Substance and Sexual Activity  . Alcohol use: No  . Drug use: No  . Sexual activity: Not on file  Lifestyle  . Physical activity:    Days per week: Not on file    Minutes per session: Not on file  . Stress: Not on file  Relationships  . Social connections:  Talks on phone: Not on file    Gets together: Not on file    Attends religious service: Not on file    Active member of club or organization: Not on file    Attends meetings of clubs or organizations: Not on file    Relationship status: Not on file  Other Topics Concern  . Not on file  Social History Narrative  . Not on file    Vitals:   05/29/18 1239  BP: 130/80  Pulse: 90  Resp: 12  Temp: 98.3 F (36.8  C)  SpO2: 95%   Body mass index is 30.59 kg/m.   Physical Exam  Nursing note and vitals reviewed. Constitutional: He is oriented to person, place, and time. He appears well-developed. He does not appear ill. No distress.  HENT:  Head: Normocephalic and atraumatic.  Right Ear: Tympanic membrane, external ear and ear canal normal.  Left Ear: Tympanic membrane, external ear and ear canal normal.  Nose: Rhinorrhea present. Right sinus exhibits no maxillary sinus tenderness and no frontal sinus tenderness. Left sinus exhibits no maxillary sinus tenderness and no frontal sinus tenderness.  Mouth/Throat: Oropharynx is clear and moist and mucous membranes are normal.  Right nostril hypertrophic turbinates. Postnasal drainage. Nasal voice.  Eyes: Conjunctivae and EOM are normal.  Cardiovascular: Normal rate and regular rhythm.  No murmur heard. Respiratory: Effort normal and breath sounds normal. No stridor. No respiratory distress.  Lymphadenopathy:       Head (right side): No submandibular adenopathy present.       Head (left side): No submandibular adenopathy present.    He has no cervical adenopathy.  Neurological: He is alert and oriented to person, place, and time. He has normal strength.  Skin: Skin is warm. No rash noted. No erythema.  Psychiatric: He has a normal mood and affect.  Well groomed, good eye contact.    ASSESSMENT AND PLAN:  [name] was seen today for cough.  Diagnoses and all orders for this visit:  URI, acute It seems to be improving. Symptoms suggests a viral etiology, symptomatic treatment recommended, I do not think abx is needed at this time.  Instructed to monitor for signs of complications, including new onset of fever among some, clearly instructed about warning signs.  F/U as needed.  Cough We discussed possible causes, including allergies and residual symptom after URI. Explained that cough and nasal congestion can last for a few days and  even weeks. Today lung auscultation is negative, so I do not think chest imaging is needed today and he agrees. Side effects of hydrocodone were discussed.  -     HYDROcodone-homatropine (HYCODAN) 5-1.5 MG/5ML syrup; Take 5 mLs by mouth at bedtime as needed for up to 10 days for cough. -     benzonatate (TESSALON) 100 MG capsule; Take 2 capsules (200 mg total) by mouth 2 (two) times daily as needed for up to 10 days.  Allergic rhinitis, unspecified seasonality, unspecified trigger  Could also aggravate above symptoms. Nasal saline irrigations several times per day as needed. Intranasal steroid, Flonase daily for a couple weeks and then as needed.  -     fluticasone (FLONASE) 50 MCG/ACT nasal spray; Place 1 spray into both nostrils 2 (two) times daily.       Marilou Barnfield G. [place], MD  [facility]. [facility] office.

## 2018-05-29 NOTE — Patient Instructions (Signed)
  [name] I have seen you today for an acute visit.  A few things to remember from today's visit:   URI, acute  Cough - Plan: HYDROcodone-homatropine (HYCODAN) 5-1.5 MG/5ML syrup, benzonatate (TESSALON) 100 MG capsule  Allergic rhinitis, unspecified seasonality, unspecified trigger - Plan: fluticasone (FLONASE) 50 MCG/ACT nasal spray   If medications prescribed today, they will not be refill upon request, a follow up appointment with PCP will be necessary to discuss continuation of of treatment if appropriate.  Nasal irrigations with saline several times per day.   In general please monitor for signs of worsening symptoms and seek immediate medical attention if any concerning.  If symptoms are not resolved in 1-2 weeks you should schedule a follow up appointment with your doctor, before if needed.  I hope you get better soon!

## 2018-08-20 ENCOUNTER — Ambulatory Visit: Payer: BC Managed Care – PPO | Admitting: Physician Assistant

## 2018-08-20 ENCOUNTER — Encounter: Payer: Self-pay | Admitting: Physician Assistant

## 2018-08-20 VITALS — BP 164/100 | HR 82 | Temp 97.9°F | Ht 68.25 in | Wt 200.0 lb

## 2018-08-20 DIAGNOSIS — Z23 Encounter for immunization: Secondary | ICD-10-CM

## 2018-08-20 DIAGNOSIS — I10 Essential (primary) hypertension: Secondary | ICD-10-CM

## 2018-08-20 MED ORDER — LOSARTAN POTASSIUM-HCTZ 100-25 MG PO TABS: 1.0000 | ORAL_TABLET | Freq: Every day | ORAL | 1 refills | Status: DC

## 2018-08-20 NOTE — Patient Instructions (Addendum)
It was great to see you!  I don't think we should adjust your blood pressure medication until we see what your blood pressure is after taking your medication consistently. I will be in touch with your lab results.  1. Keep track of your blood pressure and monitor daily. 2. Take your blood pressure medication at the same time every day. 3. Avoid salt, alcohol and caffeine. 4. Follow-up with one of the [facility] providers in 1 week or me sooner if needed.  If you develop chest pain, severe headache, shortness of breath --> go to the emergency room.  Take care,  Jarold Motto PA-C

## 2018-08-20 NOTE — Progress Notes (Signed)
Phillip Scott is a 58 y.o. male here for a follow up of a pre-existing problem.  I acted as a Neurosurgeon for [employer], PA-C Corky Mull, LPN  History of Present Illness:   Chief Complaint  Patient presents with  . Hypertension    Hypertension  This is a chronic problem. Episode onset: Pt here today due to blood pressure has been evlevated the past week. Pt only checks Bp once a week. Today was 140/110 was not feeling good so he checked it. The problem has been gradually worsening since onset. The problem is uncontrolled. Associated symptoms include headaches and malaise/fatigue. Pertinent negatives include no blurred vision, chest pain, neck pain, palpitations, peripheral edema or shortness of breath. (Bilateral leg aches) Compliance problems: Pt does not take medication everyday, takes it maybe 3-4 times a week.  Identifiable causes of hypertension include sleep apnea.   BP Readings from Last 3 Encounters:  08/20/18 (!) 164/100  05/29/18 130/80  11/28/17 130/90    Wt Readings from Last 5 Encounters:  08/20/18 200 lb (90.7 kg)  05/29/18 201 lb 3.2 oz (91.3 kg)  11/28/17 203 lb 12.8 oz (92.4 kg)  04/26/16 195 lb (88.5 kg)  04/25/16 195 lb 9.6 oz (88.7 kg)   He admits to having OSA. States that he was told "its optional" for him to wear a CPAP and he doesn't.  He also admits that about one month ago he completely stopped his blood pressure medication because he   Past Medical History:  Diagnosis Date  . Allergic rhinitis   . Hypertension      Social History   Socioeconomic History  . Marital status: Married    Spouse name: Not on file  . Number of children: Not on file  . Years of education: Not on file  . Highest education level: Not on file  Occupational History  . Occupation: Advertising copywriter  Social Needs  . Financial resource strain: Not on file  . Food insecurity:    Worry: Not on file    Inability: Not on file  . Transportation needs:    Medical: Not on  file    Non-medical: Not on file  Tobacco Use  . Smoking status: Never Smoker  . Smokeless tobacco: Never Used  Substance and Sexual Activity  . Alcohol use: No  . Drug use: No  . Sexual activity: Not on file  Lifestyle  . Physical activity:    Days per week: Not on file    Minutes per session: Not on file  . Stress: Not on file  Relationships  . Social connections:    Talks on phone: Not on file    Gets together: Not on file    Attends religious service: Not on file    Active member of club or organization: Not on file    Attends meetings of clubs or organizations: Not on file    Relationship status: Not on file  . Intimate partner violence:    Fear of current or ex partner: Not on file    Emotionally abused: Not on file    Physically abused: Not on file    Forced sexual activity: Not on file  Other Topics Concern  . Not on file  Social History Narrative  . Not on file    Past Surgical History:  Procedure Laterality Date  . KNEE SURGERY     x 2     Family History  Problem Relation Age of Onset  . Hypertension Father   .  Hypertension Other        multiple family members    Allergies  Allergen Reactions  . Lisinopril     REACTION: cough    Current Medications:   Current Outpatient Medications:  .  fluticasone (FLONASE) 50 MCG/ACT nasal spray, Place 1 spray into both nostrils 2 (two) times daily., Disp: 16 g, Rfl: 0 .  losartan-hydrochlorothiazide (HYZAAR) 100-25 MG tablet, Take 1 tablet by mouth daily., Disp: 90 tablet, Rfl: 1   Review of Systems:   Review of Systems  Constitutional: Positive for malaise/fatigue.  Eyes: Negative for blurred vision.  Respiratory: Negative for shortness of breath.   Cardiovascular: Negative for chest pain and palpitations.  Musculoskeletal: Negative for neck pain.  Neurological: Positive for headaches.    Vitals:   Vitals:   08/20/18 1345  BP: (!) 164/100  Pulse: 82  Temp: 97.9 F (36.6 C)  TempSrc: Oral   SpO2: 96%  Weight: 200 lb (90.7 kg)  Height: 5' 8.25" (1.734 m)     Body mass index is 30.19 kg/m.  Physical Exam:   Physical Exam Vitals signs and nursing note reviewed.  Constitutional:      General: He is not in acute distress.    Appearance: He is well-developed. He is not ill-appearing or toxic-appearing.  Cardiovascular:     Rate and Rhythm: Normal rate and regular rhythm.     Pulses: Normal pulses.     Heart sounds: Normal heart sounds, S1 normal and S2 normal.     Comments: No LE edema Pulmonary:     Effort: Pulmonary effort is normal.     Breath sounds: Normal breath sounds.  Skin:    General: Skin is warm and dry.  Neurological:     Mental Status: He is alert.     GCS: GCS eye subscore is 4. GCS verbal subscore is 5. GCS motor subscore is 6.  Psychiatric:        Speech: Speech normal.        Behavior: Behavior normal. Behavior is cooperative.    EKG tracing is personally reviewed.  EKG notes NSR.  No acute changes when compared to EKG from 04/26/16.   Assessment and Plan:   Phillip Scott was seen today for hypertension.  Diagnoses and all orders for this visit:  Essential hypertension EKG tracing is personally reviewed.  EKG notes NSR.  No acute changes when compared to EKG from 04/26/16. Re-start Hyzaar 100-25 mg and take daily. Keep BP log. Follow-up in 1 week. Patient was instructed to go to the ER if he develops chest pain, SOB, or other concerning symptoms -     EKG 12-Lead -     CBC with Differential/Platelet -     Comprehensive metabolic panel -     TSH  Need for prophylactic vaccination and inoculation against influenza -     Flu Vaccine QUAD 36+ mos IM  Other orders -     losartan-hydrochlorothiazide (HYZAAR) 100-25 MG tablet; Take 1 tablet by mouth daily.  . Reviewed expectations re: course of current medical issues. . Discussed self-management of symptoms. . Outlined signs and symptoms indicating need for more acute intervention. . Patient  verbalized understanding and all questions were answered. . See orders for this visit as documented in the electronic medical record. . Patient received an After-Visit Summary.  CMA or LPN served as scribe during this visit. History, Physical, and Plan performed by medical provider. The above documentation has been reviewed and is accurate and complete.  Inda Coke, PA-C

## 2018-08-20 NOTE — Progress Notes (Deleted)
Phillip Scott is a 58 y.o. male here for a follow up of a pre-existing problem.  SCRIBE STATEMENT  History of Present Illness:   No chief complaint on file.   HPI  Past Medical History:  Diagnosis Date  . Allergic rhinitis   . Hypertension      Social History   Socioeconomic History  . Marital status: Married    Spouse name: Not on file  . Number of children: Not on file  . Years of education: Not on file  . Highest education level: Not on file  Occupational History  . Occupation: Advertising copywriter  Social Needs  . Financial resource strain: Not on file  . Food insecurity:    Worry: Not on file    Inability: Not on file  . Transportation needs:    Medical: Not on file    Non-medical: Not on file  Tobacco Use  . Smoking status: Never Smoker  . Smokeless tobacco: Never Used  Substance and Sexual Activity  . Alcohol use: No  . Drug use: No  . Sexual activity: Not on file  Lifestyle  . Physical activity:    Days per week: Not on file    Minutes per session: Not on file  . Stress: Not on file  Relationships  . Social connections:    Talks on phone: Not on file    Gets together: Not on file    Attends religious service: Not on file    Active member of club or organization: Not on file    Attends meetings of clubs or organizations: Not on file    Relationship status: Not on file  . Intimate partner violence:    Fear of current or ex partner: Not on file    Emotionally abused: Not on file    Physically abused: Not on file    Forced sexual activity: Not on file  Other Topics Concern  . Not on file  Social History Narrative  . Not on file    Past Surgical History:  Procedure Laterality Date  . KNEE SURGERY     x 2     Family History  Problem Relation Age of Onset  . Hypertension Father   . Hypertension Other        multiple family members    Allergies  Allergen Reactions  . Lisinopril     REACTION: cough    Current Medications:   Current  Outpatient Medications:  .  fluticasone (FLONASE) 50 MCG/ACT nasal spray, Place 1 spray into both nostrils 2 (two) times daily., Disp: 16 g, Rfl: 0 .  losartan-hydrochlorothiazide (HYZAAR) 100-25 MG tablet, Take 1 tablet by mouth daily., Disp: 100 tablet, Rfl: 4   Review of Systems:   ROS  Vitals:   There were no vitals filed for this visit.   There is no height or weight on file to calculate BMI.  Physical Exam:   Physical Exam  Results for orders placed or performed in visit on 11/28/17  PSA  Result Value Ref Range   PSA 0.77 0.10 - 4.00 ng/mL  Lipid panel  Result Value Ref Range   Cholesterol 166 0 - 200 mg/dL   Triglycerides 27.5 0.0 - 149.0 mg/dL   HDL 17.00 >17.49 mg/dL   VLDL 44.9 0.0 - 67.5 mg/dL   LDL Cholesterol 916 (H) 0 - 99 mg/dL   Total CHOL/HDL Ratio 4    NonHDL 127.07   Hepatic function panel  Result Value Ref Range  Total Bilirubin 1.4 (H) 0.2 - 1.2 mg/dL   Bilirubin, Direct 0.3 0.0 - 0.3 mg/dL   Alkaline Phosphatase 42 39 - 117 U/L   AST 19 0 - 37 U/L   ALT 19 0 - 53 U/L   Total Protein 7.1 6.0 - 8.3 g/dL   Albumin 4.2 3.5 - 5.2 g/dL  CBC with Differential/Platelet  Result Value Ref Range   WBC 2.8 (L) 4.0 - 10.5 K/uL   RBC 4.40 4.22 - 5.81 Mil/uL   Hemoglobin 13.7 13.0 - 17.0 g/dL   HCT 92.1 19.4 - 17.4 %   MCV 92.1 78.0 - 100.0 fl   MCHC 33.9 30.0 - 36.0 g/dL   RDW 08.1 44.8 - 18.5 %   Platelets 190.0 150.0 - 400.0 K/uL   Neutrophils Relative % 40.3 (L) 43.0 - 77.0 %   Lymphocytes Relative 45.1 12.0 - 46.0 %   Monocytes Relative 7.9 3.0 - 12.0 %   Eosinophils Relative 5.9 (H) 0.0 - 5.0 %   Basophils Relative 0.8 0.0 - 3.0 %   Neutro Abs 1.1 (L) 1.4 - 7.7 K/uL   Lymphs Abs 1.3 0.7 - 4.0 K/uL   Monocytes Absolute 0.2 0.1 - 1.0 K/uL   Eosinophils Absolute 0.2 0.0 - 0.7 K/uL   Basophils Absolute 0.0 0.0 - 0.1 K/uL  TSH  Result Value Ref Range   TSH 1.30 0.35 - 4.50 uIU/mL  Basic metabolic panel  Result Value Ref Range   Sodium 137 135 -  145 mEq/L   Potassium 3.5 3.5 - 5.1 mEq/L   Chloride 99 96 - 112 mEq/L   CO2 31 19 - 32 mEq/L   Glucose, Bld 94 70 - 99 mg/dL   BUN 16 6 - 23 mg/dL   Creatinine, Ser 6.31 0.40 - 1.50 mg/dL   Calcium 9.4 8.4 - 49.7 mg/dL   GFR 02.63 >78.58 mL/min  POCT urinalysis dipstick  Result Value Ref Range   Color, UA yellow    Clarity, UA clear    Glucose, UA N    Bilirubin, UA N    Ketones, UA N    Spec Grav, UA 1.020 1.010 - 1.025   Blood, UA N    pH, UA 6.0 5.0 - 8.0   Protein, UA N    Urobilinogen, UA 0.2 0.2 or 1.0 E.U./dL   Nitrite, UA N    Leukocytes, UA Negative Negative   Appearance     Odor      Assessment and Plan:   There are no diagnoses linked to this encounter.  . Reviewed expectations re: course of current medical issues. . Discussed self-management of symptoms. . Outlined signs and symptoms indicating need for more acute intervention. . Patient verbalized understanding and all questions were answered. . See orders for this visit as documented in the electronic medical record. . Patient received an After-Visit Summary.  ***  Jarold Motto, PA-C

## 2018-08-21 LAB — CBC WITH DIFFERENTIAL/PLATELET
Basophils Absolute: 0 10*3/uL (ref 0.0–0.1)
Basophils Relative: 1.2 % (ref 0.0–3.0)
Eosinophils Absolute: 0.2 10*3/uL (ref 0.0–0.7)
Eosinophils Relative: 4.4 % (ref 0.0–5.0)
HCT: 44.7 % (ref 39.0–52.0)
Hemoglobin: 15.3 g/dL (ref 13.0–17.0)
Lymphocytes Relative: 39.7 % (ref 12.0–46.0)
Lymphs Abs: 1.4 10*3/uL (ref 0.7–4.0)
MCHC: 34.3 g/dL (ref 30.0–36.0)
MCV: 92.9 fl (ref 78.0–100.0)
Monocytes Absolute: 0.2 10*3/uL (ref 0.1–1.0)
Monocytes Relative: 7.1 % (ref 3.0–12.0)
Neutro Abs: 1.6 10*3/uL (ref 1.4–7.7)
Neutrophils Relative %: 47.6 % (ref 43.0–77.0)
Platelets: 194 10*3/uL (ref 150.0–400.0)
RBC: 4.82 Mil/uL (ref 4.22–5.81)
RDW: 14.9 % (ref 11.5–15.5)
WBC: 3.4 10*3/uL — ABNORMAL LOW (ref 4.0–10.5)

## 2018-08-21 LAB — COMPREHENSIVE METABOLIC PANEL
ALT: 16 U/L (ref 0–53)
AST: 21 U/L (ref 0–37)
Albumin: 4.3 g/dL (ref 3.5–5.2)
Alkaline Phosphatase: 52 U/L (ref 39–117)
BUN: 14 mg/dL (ref 6–23)
CO2: 26 mEq/L (ref 19–32)
Calcium: 9.6 mg/dL (ref 8.4–10.5)
Chloride: 103 mEq/L (ref 96–112)
Creatinine, Ser: 1.13 mg/dL (ref 0.40–1.50)
GFR: 80.28 mL/min (ref >60.00)
Glucose, Bld: 84 mg/dL (ref 70–99)
Potassium: 3.8 mEq/L (ref 3.5–5.1)
Sodium: 138 mEq/L (ref 135–145)
Total Bilirubin: 1.3 mg/dL — ABNORMAL HIGH (ref 0.2–1.2)
Total Protein: 7.3 g/dL (ref 6.0–8.3)

## 2018-08-21 LAB — TSH: TSH: 1.29 u[IU]/mL (ref 0.35–4.50)

## 2019-03-04 ENCOUNTER — Other Ambulatory Visit: Payer: Self-pay

## 2019-03-04 ENCOUNTER — Ambulatory Visit: Payer: BC Managed Care – PPO | Admitting: Physician Assistant

## 2019-03-04 ENCOUNTER — Encounter: Payer: Self-pay | Admitting: Physician Assistant

## 2019-03-04 VITALS — BP 160/100 | HR 64 | Temp 97.7°F | Ht 68.5 in | Wt 205.5 lb

## 2019-03-04 DIAGNOSIS — Z23 Encounter for immunization: Secondary | ICD-10-CM

## 2019-03-04 DIAGNOSIS — Z1159 Encounter for screening for other viral diseases: Secondary | ICD-10-CM

## 2019-03-04 DIAGNOSIS — N529 Male erectile dysfunction, unspecified: Secondary | ICD-10-CM | POA: Diagnosis not present

## 2019-03-04 DIAGNOSIS — I10 Essential (primary) hypertension: Secondary | ICD-10-CM | POA: Diagnosis not present

## 2019-03-04 DIAGNOSIS — Z114 Encounter for screening for human immunodeficiency virus [HIV]: Secondary | ICD-10-CM

## 2019-03-04 LAB — COMPREHENSIVE METABOLIC PANEL
ALT: 18 U/L (ref 0–53)
AST: 21 U/L (ref 0–37)
Albumin: 4.4 g/dL (ref 3.5–5.2)
Alkaline Phosphatase: 47 U/L (ref 39–117)
BUN: 16 mg/dL (ref 6–23)
CO2: 28 mEq/L (ref 19–32)
Calcium: 9.9 mg/dL (ref 8.4–10.5)
Chloride: 101 mEq/L (ref 96–112)
Creatinine, Ser: 1.15 mg/dL (ref 0.40–1.50)
GFR: 78.53 mL/min (ref >60.00)
Glucose, Bld: 101 mg/dL — ABNORMAL HIGH (ref 70–99)
Potassium: 3.7 mEq/L (ref 3.5–5.1)
Sodium: 137 mEq/L (ref 135–145)
Total Bilirubin: 1.3 mg/dL — ABNORMAL HIGH (ref 0.2–1.2)
Total Protein: 7.3 g/dL (ref 6.0–8.3)

## 2019-03-04 MED ORDER — TADALAFIL 20 MG PO TABS: 10.0000 mg | ORAL_TABLET | ORAL | 4 refills | Status: DC | PRN

## 2019-03-04 MED ORDER — HYDROCHLOROTHIAZIDE 25 MG PO TABS: 25.0000 mg | ORAL_TABLET | Freq: Every day | ORAL | 1 refills | Status: DC

## 2019-03-04 MED ORDER — LOSARTAN POTASSIUM 100 MG PO TABS: 100.0000 mg | ORAL_TABLET | Freq: Every day | ORAL | 1 refills | Status: DC

## 2019-03-04 NOTE — Progress Notes (Signed)
Phillip Scott is a 58 y.o. male is here for medication refills. 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  I acted as a Education administrator for [employer], PA-C Phillip Pickler, LPN  History of Present Illness:   Chief Complaint  Patient presents with  . Transfer of Care  . Medication Refill  . Hypertension    HPI   Hypertension Pt is Transfer of Care from Dr. Sherren Scott. Needs refill on blood pressure medication. Pt does not check blood pressure at home. Currently prescribed Losartan 100 mg and HCTZ 25 mg daily, however he is not taking them but 1-2 x a week 2/2 he was running out. Pt denies headaches, dizziness, blurred vision, chest pain, SOB or lower leg edema. Denies excessive caffeine intake, stimulant usage, excessive alcohol intake or increase in salt consumption.  Erectile Dysfunction Ongoing issues with ED, recently married two weeks ago. States that prior PCP gave him cialis, he would like to restart.   Health Maintenance Due  Topic Date Due  . Hepatitis C Screening  11/30/1960  . HIV Screening  04/26/1974  . COLONOSCOPY  04/26/2009  . TETANUS/TDAP  11/06/2018  . INFLUENZA VACCINE  03/01/2019    Past Medical History:  Diagnosis Date  . Allergic rhinitis   . Hypertension      Social History   Socioeconomic History  . Marital status: Married    Spouse name: Not on file  . Number of children: Not on file  . Years of education: Not on file  . Highest education level: Not on file  Occupational History  . Occupation: Secretary/administrator  Social Needs  . Financial resource strain: Not on file  . Food insecurity    Worry: Not on file    Inability: Not on file  . Transportation needs    Medical: Not on file    Non-medical: Not on file  Tobacco Use  . Smoking status: Never Smoker  . Smokeless tobacco: Never Used  Substance and Sexual Activity  . Alcohol use: No  . Drug use: No  . Sexual activity: Not on file  Lifestyle  . Physical activity    Days per week: Not on file   Minutes per session: Not on file  . Stress: Not on file  Relationships  . Social Herbalist on phone: Not on file    Gets together: Not on file    Attends religious service: Not on file    Active member of club or organization: Not on file    Attends meetings of clubs or organizations: Not on file    Relationship status: Not on file  . Intimate partner violence    Fear of current or ex partner: Not on file    Emotionally abused: Not on file    Physically abused: Not on file    Forced sexual activity: Not on file  Other Topics Concern  . Not on file  Social History Narrative  . Not on file    Past Surgical History:  Procedure Laterality Date  . KNEE SURGERY     x 2     Family History  Problem Relation Age of Onset  . Hypertension Father   . Hypertension Other        multiple family members    PMHx, SurgHx, SocialHx, FamHx, Medications, and Allergies were reviewed in the Visit Navigator and updated as appropriate.   Patient Active Problem List   Diagnosis Date Noted  . Routine general medical examination  at a health care facility 08/20/2014  . OSA (obstructive sleep apnea) 06/13/2013  . Hematuria, unspecified 06/18/2012  . Allergic rhinitis 11/05/2008  . Essential hypertension 04/24/2007    Social History   Tobacco Use  . Smoking status: Never Smoker  . Smokeless tobacco: Never Used  Substance Use Topics  . Alcohol use: No  . Drug use: No    Current Medications and Allergies:    Current Outpatient Medications:  .  fluticasone (FLONASE) 50 MCG/ACT nasal spray, Place 1 spray into both nostrils 2 (two) times daily., Disp: 16 g, Rfl: 0 .  hydrochlorothiazide (HYDRODIURIL) 25 MG tablet, Take 1 tablet (25 mg total) by mouth daily., Disp: 90 tablet, Rfl: 1 .  losartan (COZAAR) 100 MG tablet, Take 1 tablet (100 mg total) by mouth daily., Disp: 90 tablet, Rfl: 1 .  tadalafil (CIALIS) 20 MG tablet, Take 0.5-1 tablets (10-20 mg total) by mouth every other  day as needed for erectile dysfunction., Disp: 5 tablet, Rfl: 4   Allergies  Allergen Reactions  . Lisinopril     REACTION: cough    Review of Systems   Review of Systems  Constitutional: Negative for chills, fever, malaise/fatigue and weight loss.  Respiratory: Negative for shortness of breath.   Cardiovascular: Negative for chest pain, orthopnea, claudication and leg swelling.  Gastrointestinal: Negative for heartburn, nausea and vomiting.  Neurological: Negative for dizziness, tingling and headaches.    Vitals:   Vitals:   03/04/19 0940  BP: (!) 160/100  Pulse: 64  Temp: 97.7 F (36.5 C)  TempSrc: Oral  SpO2: 97%  Weight: 205 lb 8 oz (93.2 kg)  Height: 5' 8.5" (1.74 m)     Body mass index is 30.79 kg/m.   Physical Exam:    Physical Exam Vitals signs and nursing note reviewed.  Constitutional:      General: He is not in acute distress.    Appearance: He is well-developed. He is not ill-appearing or toxic-appearing.  Cardiovascular:     Rate and Rhythm: Normal rate and regular rhythm.     Pulses: Normal pulses.     Heart sounds: Normal heart sounds, S1 normal and S2 normal.     Comments: No LE edema Pulmonary:     Effort: Pulmonary effort is normal.     Breath sounds: Normal breath sounds.  Skin:    General: Skin is warm and dry.  Neurological:     Mental Status: He is alert.     GCS: GCS eye subscore is 4. GCS verbal subscore is 5. GCS motor subscore is 6.  Psychiatric:        Speech: Speech normal.        Behavior: Behavior normal. Behavior is cooperative.      Assessment and Plan:    Phillip Scott was seen today for transfer of care, medication refill and hypertension.  Diagnoses and all orders for this visit:  Essential hypertension Uncontrolled, likely 2/2 medication non-compliance. Will restart losartan and HCTZ. Follow-up in 1-2 months, sooner if concerns. Does have BP cuff, recommended close monitoring of this.  -     Comprehensive metabolic  panel  Screening for HIV (human immunodeficiency virus) -     HIV Antibody (routine testing w rflx)  Encounter for screening for other viral diseases -     Hepatitis C antibody  Erectile dysfunction, unspecified erectile dysfunction type Will trial cialis.  Other orders -     losartan (COZAAR) 100 MG tablet; Take 1 tablet (100 mg total)  by mouth daily. -     hydrochlorothiazide (HYDRODIURIL) 25 MG tablet; Take 1 tablet (25 mg total) by mouth daily. -     tadalafil (CIALIS) 20 MG tablet; Take 0.5-1 tablets (10-20 mg total) by mouth every other day as needed for erectile dysfunction.    . Reviewed expectations re: course of current medical issues. . Discussed self-management of symptoms. . Outlined signs and symptoms indicating need for more acute intervention. . Patient verbalized understanding and all questions were answered. . See orders for this visit as documented in the electronic medical record. . Patient received an After Visit Summary.  CMA or LPN served as scribe during this visit. History, Physical, and Plan performed by medical provider. The above documentation has been reviewed and is accurate and complete.   Jarold MottoSamantha Kasandra Fehr, PA-C [place] [place] 03/04/2019  Follow-up: No follow-ups on file.

## 2019-03-04 NOTE — Addendum Note (Signed)
Addended by: Marian Sorrow on: 03/04/2019 10:19 AM   Modules accepted: Orders

## 2019-03-04 NOTE — Patient Instructions (Addendum)
It was great to see you!  Please restart your blood pressure medications and take everyday. Please make sure you check your blood pressure regularly and if your numbers are greater than 140/90 on a regular basis, come back and see me.  Let's follow-up in 1-2 months, sooner if you have concerns.  Take care,  Inda Coke PA-C

## 2019-03-05 LAB — HEPATITIS C ANTIBODY
Hepatitis C Ab: NONREACTIVE
SIGNAL TO CUT-OFF: 0.04 (ref <1.00)

## 2019-03-05 LAB — HIV ANTIBODY (ROUTINE TESTING W REFLEX): HIV 1&2 Ab, 4th Generation: NONREACTIVE

## 2019-03-25 ENCOUNTER — Encounter: Payer: Self-pay | Admitting: Physician Assistant

## 2019-04-15 ENCOUNTER — Other Ambulatory Visit: Payer: Self-pay

## 2019-04-15 ENCOUNTER — Other Ambulatory Visit: Payer: Self-pay | Admitting: Physician Assistant

## 2019-04-15 ENCOUNTER — Encounter: Payer: Self-pay | Admitting: Physician Assistant

## 2019-04-15 ENCOUNTER — Ambulatory Visit: Payer: BC Managed Care – PPO | Admitting: Physician Assistant

## 2019-04-15 VITALS — BP 140/90 | HR 78 | Temp 98.5°F | Ht 68.5 in | Wt 202.5 lb

## 2019-04-15 DIAGNOSIS — G4733 Obstructive sleep apnea (adult) (pediatric): Secondary | ICD-10-CM

## 2019-04-15 DIAGNOSIS — E785 Hyperlipidemia, unspecified: Secondary | ICD-10-CM | POA: Diagnosis not present

## 2019-04-15 DIAGNOSIS — I10 Essential (primary) hypertension: Secondary | ICD-10-CM | POA: Diagnosis not present

## 2019-04-15 DIAGNOSIS — Z23 Encounter for immunization: Secondary | ICD-10-CM | POA: Diagnosis not present

## 2019-04-15 LAB — LIPID PANEL
Cholesterol: 177 mg/dL (ref 0–200)
HDL: 34.4 mg/dL — ABNORMAL LOW (ref >39.00)
LDL Cholesterol: 114 mg/dL — ABNORMAL HIGH (ref 0–99)
NonHDL: 142.21
Total CHOL/HDL Ratio: 5
Triglycerides: 140 mg/dL (ref 0.0–149.0)
VLDL: 28 mg/dL (ref 0.0–40.0)

## 2019-04-15 MED ORDER — ATORVASTATIN CALCIUM 20 MG PO TABS: 20.0000 mg | ORAL_TABLET | Freq: Every day | ORAL | 1 refills | Status: DC

## 2019-04-15 NOTE — Progress Notes (Signed)
Phillip Scott is a 58 y.o. male is here to follow up on Hypertension  I acted as a Education administrator for [employer], PA-C Anselmo Pickler, LPN  History of Present Illness:   Chief Complaint  Patient presents with  . Hypertension    HPI   Hypertension Pt here for follow up on blood pressure today.  He is currently taking losartan 100 mg daily and hydrochlorothiazide 25 mg daily pt checking blood pressure at home 3 x's a week. Pt said average has been 120/90. Diastolic number has been 90 or above. Pt denies headaches, dizziness, blurred vision, chest pain, SOB or lower leg edema. Denies excessive caffeine intake, stimulant usage, excessive alcohol intake or increase in salt consumption.  He does snore at night, and has daytime fatigue.  Apparently he did have a sleep study in 2014, however patient does not remember this.  There is a diagnosis of OSA in his chart.  We also have not checked his lipid panel, will update that today.  He does have a history of hyperlipidemia.   Health Maintenance Due  Topic Date Due  . INFLUENZA VACCINE  03/01/2019    Past Medical History:  Diagnosis Date  . Allergic rhinitis   . Hypertension      Social History   Socioeconomic History  . Marital status: Married    Spouse name: Not on file  . Number of children: Not on file  . Years of education: Not on file  . Highest education level: Not on file  Occupational History  . Occupation: Secretary/administrator  Social Needs  . Financial resource strain: Not on file  . Food insecurity    Worry: Not on file    Inability: Not on file  . Transportation needs    Medical: Not on file    Non-medical: Not on file  Tobacco Use  . Smoking status: Never Smoker  . Smokeless tobacco: Never Used  Substance and Sexual Activity  . Alcohol use: No  . Drug use: No  . Sexual activity: Not on file  Lifestyle  . Physical activity    Days per week: Not on file    Minutes per session: Not on file  . Stress: Not on  file  Relationships  . Social Herbalist on phone: Not on file    Gets together: Not on file    Attends religious service: Not on file    Active member of club or organization: Not on file    Attends meetings of clubs or organizations: Not on file    Relationship status: Not on file  . Intimate partner violence    Fear of current or ex partner: Not on file    Emotionally abused: Not on file    Physically abused: Not on file    Forced sexual activity: Not on file  Other Topics Concern  . Not on file  Social History Narrative  . Not on file    Past Surgical History:  Procedure Laterality Date  . KNEE SURGERY     x 2     Family History  Problem Relation Age of Onset  . Hypertension Father   . Hypertension Other        multiple family members    PMHx, SurgHx, SocialHx, FamHx, Medications, and Allergies were reviewed in the Visit Navigator and updated as appropriate.   Patient Active Problem List   Diagnosis Date Noted  . Routine general medical examination at a health care  facility 08/20/2014  . OSA (obstructive sleep apnea) 06/13/2013  . Hematuria, unspecified 06/18/2012  . Allergic rhinitis 11/05/2008  . Essential hypertension 04/24/2007    Social History   Tobacco Use  . Smoking status: Never Smoker  . Smokeless tobacco: Never Used  Substance Use Topics  . Alcohol use: No  . Drug use: No    Current Medications and Allergies:    Current Outpatient Medications:  .  fluticasone (FLONASE) 50 MCG/ACT nasal spray, Place 1 spray into both nostrils 2 (two) times daily., Disp: 16 g, Rfl: 0 .  hydrochlorothiazide (HYDRODIURIL) 25 MG tablet, Take 1 tablet (25 mg total) by mouth daily., Disp: 90 tablet, Rfl: 1 .  losartan (COZAAR) 100 MG tablet, Take 1 tablet (100 mg total) by mouth daily., Disp: 90 tablet, Rfl: 1 .  tadalafil (CIALIS) 20 MG tablet, Take 0.5-1 tablets (10-20 mg total) by mouth every other day as needed for erectile dysfunction., Disp: 5  tablet, Rfl: 4   Allergies  Allergen Reactions  . Lisinopril     REACTION: cough    Review of Systems   ROS Negative unless otherwise specified per HPI.  Vitals:   Vitals:   04/15/19 0934  BP: 140/90  Pulse: 78  Temp: 98.5 F (36.9 C)  TempSrc: Temporal  SpO2: 95%  Weight: 202 lb 8 oz (91.9 kg)  Height: 5' 8.5" (1.74 m)     Body mass index is 30.34 kg/m.   Physical Exam:    Physical Exam Vitals signs and nursing note reviewed.  Constitutional:      General: He is not in acute distress.    Appearance: He is well-developed. He is not ill-appearing or toxic-appearing.  Cardiovascular:     Rate and Rhythm: Normal rate and regular rhythm.     Pulses: Normal pulses.     Heart sounds: Normal heart sounds, S1 normal and S2 normal.     Comments: No LE edema Pulmonary:     Effort: Pulmonary effort is normal.     Breath sounds: Normal breath sounds.  Skin:    General: Skin is warm and dry.  Neurological:     Mental Status: He is alert.     GCS: GCS eye subscore is 4. GCS verbal subscore is 5. GCS motor subscore is 6.  Psychiatric:        Speech: Speech normal.        Behavior: Behavior normal. Behavior is cooperative.      Assessment and Plan:    Phillip Scott was seen today for hypertension.  Diagnoses and all orders for this visit:  Essential hypertension He reports that on average his blood pressures at home are 120/90.  I do not want to change his medications right now but I do want him to obtain a sleep study.  He states that he is agreeable to this.  Follow-up in 6 months. -     Lipid panel  Need for immunization against influenza -     Flu Vaccine QUAD 36+ mos IM  Hyperlipidemia We will update labs.  Also recalculate ASCVD and recommend statin if needed.  OSA (obstructive sleep apnea) -     Ambulatory referral to Sleep Studies  . Reviewed expectations re: course of current medical issues. . Discussed self-management of symptoms. . Outlined signs and  symptoms indicating need for more acute intervention. . Patient verbalized understanding and all questions were answered. . See orders for this visit as documented in the electronic medical record. . Patient received  an After Visit Summary.  CMA or LPN served as scribe during this visit. History, Physical, and Plan performed by medical provider. The above documentation has been reviewed and is accurate and complete.  Jarold MottoSamantha Kohlton Gilpatrick, PA-C [place], [place] 04/15/2019  Follow-up: No follow-ups on file.

## 2019-04-15 NOTE — Patient Instructions (Signed)
It was great to see you!  Please follow-up in 6 months.  Continue blood pressure medication.  Please have your sleep study performed, someone will call you from a neurology office for this within a month or so.  Take care,  Inda Coke PA-C

## 2019-04-16 ENCOUNTER — Other Ambulatory Visit: Payer: Self-pay | Admitting: *Deleted

## 2019-04-16 DIAGNOSIS — E7800 Pure hypercholesterolemia, unspecified: Secondary | ICD-10-CM

## 2019-08-20 ENCOUNTER — Other Ambulatory Visit: Payer: Self-pay

## 2019-08-21 ENCOUNTER — Encounter: Payer: Self-pay | Admitting: Physician Assistant

## 2019-08-21 ENCOUNTER — Ambulatory Visit (INDEPENDENT_AMBULATORY_CARE_PROVIDER_SITE_OTHER): Payer: BC Managed Care – PPO | Admitting: Physician Assistant

## 2019-08-21 VITALS — BP 140/90 | HR 84 | Temp 98.3°F | Ht 68.5 in | Wt 195.0 lb

## 2019-08-21 DIAGNOSIS — I10 Essential (primary) hypertension: Secondary | ICD-10-CM | POA: Diagnosis not present

## 2019-08-21 DIAGNOSIS — R350 Frequency of micturition: Secondary | ICD-10-CM

## 2019-08-21 LAB — POCT URINALYSIS DIPSTICK
Bilirubin, UA: NEGATIVE
Blood, UA: NEGATIVE
Glucose, UA: NEGATIVE
Ketones, UA: NEGATIVE
Leukocytes, UA: NEGATIVE
Nitrite, UA: NEGATIVE
Protein, UA: NEGATIVE
Spec Grav, UA: 1.01 (ref 1.010–1.025)
Urobilinogen, UA: 0.2 E.U./dL
pH, UA: 7 (ref 5.0–8.0)

## 2019-08-21 MED ORDER — AMLODIPINE BESYLATE 5 MG PO TABS: 5.0000 mg | ORAL_TABLET | Freq: Every day | ORAL | 1 refills | Status: DC

## 2019-08-21 NOTE — Patient Instructions (Signed)
It was great to see you!  Stop Hydrochlorothiazide. Start Amlodipine. Continue Losartan.  Let's follow-up in 2 weeks if urinary frequency doesn't get better.  OR  Let's follow-up in 4 weeks for blood pressure re-evaluation.  Take care,  Jarold Motto PA-C

## 2019-08-21 NOTE — Progress Notes (Signed)
Phillip Scott is a 59 y.o. male here for a new problem.  I acted as a Neurosurgeon for [employer], PA-C Corky Mull, LPN  History of Present Illness:   Chief Complaint  Patient presents with  . Urinary Frequency    HPI   Urinary frequency Pt c/o frequency started 3 weeks ago. Denies dysuria, back pain, night sweats, nausea, weak stream, nocturia, penile discharge, concerns for STDs, family hx prostate cancer, urgency.  Drinks coffee in the mornings. Stopped drinking sodas, stopped eating high fat foods. Intentionally has lost 7 lb since he last saw me.  Wt Readings from Last 4 Encounters:  08/21/19 195 lb (88.5 kg)  04/15/19 202 lb 8 oz (91.9 kg)  03/04/19 205 lb 8 oz (93.2 kg)  08/20/18 200 lb (90.7 kg)     Past Medical History:  Diagnosis Date  . Allergic rhinitis   . Hypertension      Social History   Socioeconomic History  . Marital status: Married    Spouse name: Not on file  . Number of children: Not on file  . Years of education: Not on file  . Highest education level: Not on file  Occupational History  . Occupation: Housekeeper  Tobacco Use  . Smoking status: Never Smoker  . Smokeless tobacco: Never Used  Substance and Sexual Activity  . Alcohol use: No  . Drug use: No  . Sexual activity: Not on file  Other Topics Concern  . Not on file  Social History Narrative  . Not on file   Social Determinants of Health   Financial Resource Strain:   . Difficulty of Paying Living Expenses: Not on file  Food Insecurity:   . Worried About Programme researcher, broadcasting/film/video in the Last Year: Not on file  . Ran Out of Food in the Last Year: Not on file  Transportation Needs:   . Lack of Transportation (Medical): Not on file  . Lack of Transportation (Non-Medical): Not on file  Physical Activity:   . Days of Exercise per Week: Not on file  . Minutes of Exercise per Session: Not on file  Stress:   . Feeling of Stress : Not on file  Social Connections:   . Frequency  of Communication with Friends and Family: Not on file  . Frequency of Social Gatherings with Friends and Family: Not on file  . Attends Religious Services: Not on file  . Active Member of Clubs or Organizations: Not on file  . Attends Banker Meetings: Not on file  . Marital Status: Not on file  Intimate Partner Violence:   . Fear of Current or Ex-Partner: Not on file  . Emotionally Abused: Not on file  . Physically Abused: Not on file  . Sexually Abused: Not on file    Past Surgical History:  Procedure Laterality Date  . KNEE SURGERY     x 2     Family History  Problem Relation Age of Onset  . Hypertension Father   . Hypertension Other        multiple family members    Allergies  Allergen Reactions  . Lisinopril     REACTION: cough    Current Medications:   Current Outpatient Medications:  .  atorvastatin (LIPITOR) 20 MG tablet, Take 1 tablet (20 mg total) by mouth daily., Disp: 90 tablet, Rfl: 1 .  losartan (COZAAR) 100 MG tablet, Take 1 tablet (100 mg total) by mouth daily., Disp: 90 tablet, Rfl: 1 .  tadalafil (CIALIS) 20 MG tablet, Take 0.5-1 tablets (10-20 mg total) by mouth every other day as needed for erectile dysfunction., Disp: 5 tablet, Rfl: 4 .  amLODipine (NORVASC) 5 MG tablet, Take 1 tablet (5 mg total) by mouth daily., Disp: 30 tablet, Rfl: 1   Review of Systems:   ROS  Negative unless otherwise specified per HPI.  Vitals:   Vitals:   08/21/19 1012  BP: 140/90  Pulse: 84  Temp: 98.3 F (36.8 C)  TempSrc: Temporal  SpO2: 96%  Weight: 195 lb (88.5 kg)  Height: 5' 8.5" (1.74 m)     Body mass index is 29.22 kg/m.  Physical Exam:   Physical Exam Vitals and nursing note reviewed. Exam conducted with a chaperone present Butch Penny, LPN).  Constitutional:      General: He is not in acute distress.    Appearance: He is well-developed. He is not ill-appearing or toxic-appearing.  Cardiovascular:     Rate and Rhythm: Normal rate and  regular rhythm.     Pulses: Normal pulses.     Heart sounds: Normal heart sounds, S1 normal and S2 normal.     Comments: No LE edema Pulmonary:     Effort: Pulmonary effort is normal.     Breath sounds: Normal breath sounds.  Genitourinary:    Prostate: Normal. Not tender.  Skin:    General: Skin is warm and dry.  Neurological:     Mental Status: He is alert.     GCS: GCS eye subscore is 4. GCS verbal subscore is 5. GCS motor subscore is 6.  Psychiatric:        Speech: Speech normal.        Behavior: Behavior normal. Behavior is cooperative.     Results for orders placed or performed in visit on 08/21/19  POCT urinalysis dipstick  Result Value Ref Range   Color, UA Yellow    Clarity, UA Clear    Glucose, UA Negative Negative   Bilirubin, UA Negative    Ketones, UA Negative    Spec Grav, UA 1.010 1.010 - 1.025   Blood, UA Negative    pH, UA 7.0 5.0 - 8.0   Protein, UA Negative Negative   Urobilinogen, UA 0.2 0.2 or 1.0 E.U./dL   Nitrite, UA Negative    Leukocytes, UA Negative Negative   Appearance     Odor      Assessment and Plan:   Ceaser was seen today for urinary frequency.  Diagnoses and all orders for this visit:  Urinary frequency UA normal, prostate exam normal. Will switch HCTZ to Norvasc to see if this will help with his symptoms. I advised him to follow-up in 2 weeks if urinary frequency continues/worsens. -     POCT urinalysis dipstick -     Urine Culture  Essential hypertension Uncontrolled. Stop HCTZ and start norvasc 5 mg daily. Follow-up in 4 weeks for blood pressure recheck since we are adjusting his regimen today.  Other orders -     amLODipine (NORVASC) 5 MG tablet; Take 1 tablet (5 mg total) by mouth daily.  . Reviewed expectations re: course of current medical issues. . Discussed self-management of symptoms. . Outlined signs and symptoms indicating need for more acute intervention. . Patient verbalized understanding and all questions were  answered. . See orders for this visit as documented in the electronic medical record. . Patient received an After-Visit Summary.  CMA or LPN served as scribe during this visit. History, Physical, and Plan  performed by medical provider. The above documentation has been reviewed and is accurate and complete.   Inda Coke, PA-C

## 2019-08-22 LAB — URINE CULTURE
MICRO NUMBER:: 10066487
Result:: NO GROWTH
SPECIMEN QUALITY:: ADEQUATE

## 2019-08-31 ENCOUNTER — Other Ambulatory Visit: Payer: Self-pay | Admitting: Physician Assistant

## 2019-09-04 ENCOUNTER — Other Ambulatory Visit: Payer: Self-pay | Admitting: Physician Assistant

## 2019-10-17 ENCOUNTER — Other Ambulatory Visit: Payer: Self-pay | Admitting: Physician Assistant

## 2019-10-21 ENCOUNTER — Other Ambulatory Visit: Payer: Self-pay | Admitting: Physician Assistant

## 2019-11-19 ENCOUNTER — Other Ambulatory Visit: Payer: Self-pay | Admitting: Physician Assistant

## 2019-12-03 ENCOUNTER — Other Ambulatory Visit: Payer: Self-pay | Admitting: Physician Assistant

## 2019-12-20 ENCOUNTER — Other Ambulatory Visit: Payer: Self-pay | Admitting: Physician Assistant

## 2019-12-24 ENCOUNTER — Telehealth: Payer: Self-pay

## 2019-12-24 NOTE — Telephone Encounter (Signed)
Patient states that he make a co pay each time he come in to the office. Patient states that he also receive a bill after each visit. Patient would like to know what is going on. Asked patient was he being billed for labs and he is unsure.

## 2020-01-09 NOTE — Telephone Encounter (Signed)
Spoke with patient today.  He has just recently updated his PCP with his insurance company.  I have asked patient to reach out to his insurance company to verify coverage.  Patient had an incoming phone number.  States will give me a call back if he has any more questions in regard.

## 2020-01-16 ENCOUNTER — Other Ambulatory Visit: Payer: Self-pay | Admitting: Physician Assistant

## 2020-03-05 ENCOUNTER — Other Ambulatory Visit: Payer: Self-pay | Admitting: Physician Assistant

## 2020-03-18 ENCOUNTER — Encounter: Payer: Self-pay | Admitting: Family Medicine

## 2020-03-18 ENCOUNTER — Other Ambulatory Visit: Payer: Self-pay

## 2020-03-18 ENCOUNTER — Ambulatory Visit (INDEPENDENT_AMBULATORY_CARE_PROVIDER_SITE_OTHER): Payer: BC Managed Care – PPO | Admitting: Family Medicine

## 2020-03-18 VITALS — BP 155/93 | HR 82 | Temp 98.6°F | Ht 68.5 in | Wt 191.8 lb

## 2020-03-18 DIAGNOSIS — K644 Residual hemorrhoidal skin tags: Secondary | ICD-10-CM | POA: Diagnosis not present

## 2020-03-18 MED ORDER — DICLOFENAC SODIUM 75 MG PO TBEC: 75.0000 mg | DELAYED_RELEASE_TABLET | Freq: Two times a day (BID) | ORAL | 0 refills | Status: DC

## 2020-03-18 MED ORDER — HYDROCORTISONE ACETATE 25 MG RE SUPP: 25.0000 mg | Freq: Two times a day (BID) | RECTAL | 0 refills | Status: DC

## 2020-03-18 NOTE — Progress Notes (Signed)
   Phillip Scott is a 59 y.o. male who presents today for an office visit.  Assessment/Plan:  External Hemorrhoid No red flags.  Discussed importance of good oral hydration and fiber in his diet.  Can use stool softeners as needed.  Will start hydrocortisone suppository.  Discussed sitz baths.  Discussed reasons to return to care.  Left shoulder pain Likely pectoralis strain.  No red flags.  Pain is reproducible on exam.  Will start diclofenac.  Discussed home exercises.  Handout was given.  Discussed reasons to return to care.    Subjective:  HPI:  Patient is concerned for a boil on his rectal area starting about 3 days ago. Not changing. No fevers or chills. No constipation or diarrhea issues. No drainage. Hot shower seemed to help.   Some shoulder pain for the last 3 weeks. Worse with certain motions. No treatments tried. No obvious injuries or precipitating events.          Objective:  Physical Exam: BP (!) 155/93   Pulse 82   Temp 98.6 F (37 C) (Temporal)   Ht 5' 8.5" (1.74 m)   Wt 191 lb 12.8 oz (87 kg)   SpO2 98%   BMI 28.74 kg/m   Gen: No acute distress, resting comfortably CV: Regular rate and rhythm with no murmurs appreciated Pulm: Normal work of breathing, clear to auscultation bilaterally with no crackles, wheezes, or rhonchi MSK: Pain with palpation of left lateral pectoralis.  Worse with extension of left shoulder.  Rotator cuff intact with mild pain with resisted supraspinatus testing.  Crossover test normal. GU: Approximately 1 cm external hemorrhoid noted. Neuro: Grossly normal, moves all extremities Psych: Normal affect and thought content  Time Spent: 35 minutes of total time was spent on the date of the encounter performing the following actions: chart review prior to seeing the patient, obtaining history, performing a medically necessary exam including shoulder exam and rectal exam, counseling on the treatment plan, placing orders, and documenting in  our EHR.        Katina Degree. [name], MD 03/18/2020 11:18 AM

## 2020-03-18 NOTE — Patient Instructions (Signed)
It was very nice to see you today!  You have a hemorrhoid.  Please start the suppository.  Please make sure that you are getting plenty of water and plenty of fiber in your diet.  You can try taking a stool softener such as docusate as well.  Please try using the sitz bath.  Let us know if not improving over the next couple of weeks.  I think you have a pectoralis strain.  Please start the diclofenac.  Work on exercises.  Let us know if not improving over the next couple weeks.  Take care, Dr [name]  Please try these tips to maintain a healthy lifestyle:   Eat at least 3 REAL meals and 1-2 snacks per day.  Aim for no more than 5 hours between eating.  If you eat breakfast, please do so within one hour of getting up.    Each meal should contain half fruits/vegetables, one quarter protein, and one quarter carbs (no bigger than a computer mouse)   Cut down on sweet beverages. This includes juice, soda, and sweet tea.     Drink at least 1 glass of water with each meal and aim for at least 8 glasses per day   Exercise at least 150 minutes every week.

## 2020-03-20 ENCOUNTER — Other Ambulatory Visit: Payer: Self-pay | Admitting: Physician Assistant

## 2020-04-16 ENCOUNTER — Other Ambulatory Visit: Payer: Self-pay | Admitting: Physician Assistant

## 2020-05-14 ENCOUNTER — Other Ambulatory Visit: Payer: Self-pay

## 2020-05-14 ENCOUNTER — Ambulatory Visit (INDEPENDENT_AMBULATORY_CARE_PROVIDER_SITE_OTHER): Payer: BC Managed Care – PPO | Admitting: Family Medicine

## 2020-05-14 ENCOUNTER — Encounter: Payer: Self-pay | Admitting: Family Medicine

## 2020-05-14 VITALS — BP 146/79 | HR 78 | Temp 98.6°F | Ht 68.5 in | Wt 192.6 lb

## 2020-05-14 DIAGNOSIS — Z23 Encounter for immunization: Secondary | ICD-10-CM | POA: Diagnosis not present

## 2020-05-14 DIAGNOSIS — I10 Essential (primary) hypertension: Secondary | ICD-10-CM

## 2020-05-14 MED ORDER — DICLOFENAC SODIUM 75 MG PO TBEC
75.0000 mg | DELAYED_RELEASE_TABLET | Freq: Two times a day (BID) | ORAL | 0 refills | Status: DC
Start: 2020-05-14 — End: 2020-12-15

## 2020-05-14 MED ORDER — CYCLOBENZAPRINE HCL 10 MG PO TABS: 10.0000 mg | ORAL_TABLET | Freq: Three times a day (TID) | ORAL | 0 refills | Status: DC | PRN

## 2020-05-14 MED ORDER — DICLOFENAC SODIUM 75 MG PO TBEC
75.0000 mg | DELAYED_RELEASE_TABLET | Freq: Two times a day (BID) | ORAL | 0 refills | Status: DC
Start: 2020-05-14 — End: 2020-05-14

## 2020-05-14 NOTE — Patient Instructions (Addendum)
It was very nice to see you today!  You have a low back strain.  Please take the Flexeril and the diclofenac.  Please work on exercises and use a heating pad.  Let us know if not improving in the next week or so.  Take care, Dr [name]  Please try these tips to maintain a healthy lifestyle:   Eat at least 3 REAL meals and 1-2 snacks per day.  Aim for no more than 5 hours between eating.  If you eat breakfast, please do so within one hour of getting up.    Each meal should contain half fruits/vegetables, one quarter protein, and one quarter carbs (no bigger than a computer mouse)   Cut down on sweet beverages. This includes juice, soda, and sweet tea.     Drink at least 1 glass of water with each meal and aim for at least 8 glasses per day   Exercise at least 150 minutes every week.

## 2020-05-14 NOTE — Progress Notes (Signed)
   Phillip Scott is a 59 y.o. male who presents today for an office visit.  Assessment/Plan:  New/Acute Problems: Back Pain No red flags.  Likely muscular strain.  Reassuring exam today.  Will start Flexeril.  Continue diclofenac.  Recommended heating pad and discussed home exercises.  Handout was given.  Offered Depo-Medrol injection however patient declined.  Discussed reasons to return to care.  Chronic Problems Addressed Today: Essential Hypertension Elevated today in setting of acute pain.  Continue home monitoring with goal 140/90 or lower.  Continue Norvasc 5 mg daily and losartan 100 mg daily.  Flu vaccine given today.     Subjective:  HPI:  Patient here with right lower back pain for the last couple of weeks.  No obvious injuries or precipitating events.  Tried taking diclofenac with no significant improvement.  No dysuria.  No hematuria.  No nausea or vomiting.  No weakness or numbness.  No reported bowel or bladder incontinence.       Objective:  Physical Exam: BP (!) 146/79   Pulse 78   Temp 98.6 F (37 C) (Temporal)   Ht 5' 8.5" (1.74 m)   Wt 192 lb 9.6 oz (87.4 kg)   SpO2 97%   BMI 28.86 kg/m   Gen: No acute distress, resting comfortably CV: Regular rate and rhythm with no murmurs appreciated Pulm: Normal work of breathing, clear to auscultation bilaterally with no crackles, wheezes, or rhonchi MSK:  -Back: No deformities.  Tender palpation along right lower lumbar paraspinal muscles. -Lower extremities: Full range of motion.  Reflexes 2+ and symmetric bilaterally.  Strength normal bilaterally. Neuro: Grossly normal, moves all extremities Psych: Normal affect and thought content      Phillip Scott M. [name], MD 05/14/2020 1:45 PM

## 2020-05-24 ENCOUNTER — Encounter: Payer: Self-pay | Admitting: Family Medicine

## 2020-06-05 ENCOUNTER — Other Ambulatory Visit: Payer: Self-pay | Admitting: Physician Assistant

## 2020-06-19 ENCOUNTER — Other Ambulatory Visit: Payer: Self-pay | Admitting: Physician Assistant

## 2020-07-15 ENCOUNTER — Other Ambulatory Visit: Payer: Self-pay | Admitting: Physician Assistant

## 2020-09-08 ENCOUNTER — Other Ambulatory Visit: Payer: Self-pay | Admitting: Physician Assistant

## 2020-09-13 ENCOUNTER — Telehealth: Payer: Self-pay

## 2020-09-13 NOTE — Telephone Encounter (Signed)
MEDICATION: losartan, amlodipine    PHARMACY: [facility] [address] [place]  Comments:   **Let patient know to contact pharmacy at the end of the day to make sure medication is ready. **  ** Please notify patient to allow 48-72 hours to process**  **Encourage patient to contact the pharmacy for refills or they can request refills through [facility]**

## 2020-09-13 NOTE — Telephone Encounter (Signed)
Rxs were sent to pharmacy

## 2020-10-14 ENCOUNTER — Encounter (HOSPITAL_COMMUNITY): Payer: Self-pay

## 2020-10-14 ENCOUNTER — Other Ambulatory Visit: Payer: Self-pay

## 2020-10-14 ENCOUNTER — Emergency Department (HOSPITAL_COMMUNITY)
Admission: EM | Admit: 2020-10-14 | Discharge: 2020-10-14 | Disposition: A | Discharge status: Home / Self Care | Payer: BC Managed Care – PPO | Attending: Emergency Medicine | Admitting: Emergency Medicine

## 2020-10-14 ENCOUNTER — Emergency Department (HOSPITAL_COMMUNITY): Payer: BC Managed Care – PPO

## 2020-10-14 ENCOUNTER — Telehealth: Payer: Self-pay

## 2020-10-14 DIAGNOSIS — I10 Essential (primary) hypertension: Secondary | ICD-10-CM | POA: Insufficient documentation

## 2020-10-14 DIAGNOSIS — Z79899 Other long term (current) drug therapy: Secondary | ICD-10-CM | POA: Insufficient documentation

## 2020-10-14 DIAGNOSIS — R079 Chest pain, unspecified: Secondary | ICD-10-CM | POA: Diagnosis present

## 2020-10-14 DIAGNOSIS — M542 Cervicalgia: Secondary | ICD-10-CM | POA: Insufficient documentation

## 2020-10-14 LAB — HEPATIC FUNCTION PANEL
ALT: 26 U/L (ref 0–44)
AST: 23 U/L (ref 15–41)
Albumin: 4.3 g/dL (ref 3.5–5.0)
Alkaline Phosphatase: 50 U/L (ref 38–126)
Bilirubin, Direct: 0.3 mg/dL — ABNORMAL HIGH (ref 0.0–0.2)
Indirect Bilirubin: 1.8 mg/dL — ABNORMAL HIGH (ref 0.3–0.9)
Total Bilirubin: 2.1 mg/dL — ABNORMAL HIGH (ref 0.3–1.2)
Total Protein: 7.7 g/dL (ref 6.5–8.1)

## 2020-10-14 LAB — CBC
HCT: 41.1 % (ref 39.0–52.0)
Hemoglobin: 14.4 g/dL (ref 13.0–17.0)
MCH: 32.4 pg (ref 26.0–34.0)
MCHC: 35 g/dL (ref 30.0–36.0)
MCV: 92.4 fL (ref 80.0–100.0)
Platelets: 182 10*3/uL (ref 150–400)
RBC: 4.45 MIL/uL (ref 4.22–5.81)
RDW: 12.3 % (ref 11.5–15.5)
WBC: 3.8 10*3/uL — ABNORMAL LOW (ref 4.0–10.5)
nRBC: 0 % (ref 0.0–0.2)

## 2020-10-14 LAB — TROPONIN I (HIGH SENSITIVITY)
Troponin I (High Sensitivity): 6 ng/L (ref <18)
Troponin I (High Sensitivity): 7 ng/L (ref <18)

## 2020-10-14 LAB — BASIC METABOLIC PANEL
Anion gap: 6 (ref 5–15)
BUN: 15 mg/dL (ref 8–23)
CO2: 28 mmol/L (ref 22–32)
Calcium: 9.2 mg/dL (ref 8.9–10.3)
Chloride: 105 mmol/L (ref 98–111)
Creatinine, Ser: 1.15 mg/dL (ref 0.61–1.24)
GFR, Estimated: >60 mL/min (ref >60)
Glucose, Bld: 86 mg/dL (ref 70–99)
Potassium: 3.9 mmol/L (ref 3.5–5.1)
Sodium: 139 mmol/L (ref 135–145)

## 2020-10-14 LAB — LIPASE, BLOOD: Lipase: 23 U/L (ref 11–51)

## 2020-10-14 MED ORDER — ALUM & MAG HYDROXIDE-SIMETH 200-200-20 MG/5ML PO SUSP
30.0000 mL | Freq: Once | ORAL | Status: AC
  Administered 2020-10-14: 30 mL via ORAL
  Filled 2020-10-14: qty 30

## 2020-10-14 MED ORDER — FAMOTIDINE IN NACL 20-0.9 MG/50ML-% IV SOLN
20.0000 mg | Freq: Once | INTRAVENOUS | Status: AC
  Administered 2020-10-14: 20 mg via INTRAVENOUS
  Filled 2020-10-14: qty 50

## 2020-10-14 MED ORDER — LIDOCAINE VISCOUS HCL 2 % MT SOLN
15.0000 mL | Freq: Once | OROMUCOSAL | Status: AC
  Administered 2020-10-14: 15 mL via ORAL
  Filled 2020-10-14: qty 15

## 2020-10-14 MED ORDER — METHOCARBAMOL 500 MG PO TABS: 500.0000 mg | ORAL_TABLET | Freq: Two times a day (BID) | ORAL | 0 refills | Status: DC

## 2020-10-14 MED ORDER — OMEPRAZOLE 20 MG PO CPDR: 20.0000 mg | DELAYED_RELEASE_CAPSULE | Freq: Every day | ORAL | 0 refills | Status: DC

## 2020-10-14 NOTE — ED Triage Notes (Signed)
Pt here today due to cp. Pt states cp started yesterday with right sided neck pain. Pt reports it was very mild. Pt states he no longer has cp but wanted to get check out.

## 2020-10-14 NOTE — ED Notes (Signed)
Pt verbalized understanding of d/c instructions, follow up and medications. Pt ambulatory to WR with steady gait, NAD

## 2020-10-14 NOTE — Telephone Encounter (Signed)
FYI, see Triage note. 

## 2020-10-14 NOTE — Discharge Instructions (Addendum)
Please follow-up with your primary care doctor.  Please take omeprazole as prescribed also take Robaxin as needed for pain.  Drink plenty of water and you may use Tylenol 1000 mg every 6 hours for pain as well.  Return to the ER for any new or concerning symptoms including fevers, worsening pain or new extremity symptoms such as shortness of breath. As we discussed your bilirubin is very mildly elevated today.  This may be unrelated to your symptoms however you should discuss this finding with your primary care doctor.

## 2020-10-14 NOTE — Telephone Encounter (Signed)
Nurse Assessment Nurse: Odis Luster, RN, Bjorn Loser Date/Time (Eastern Time): 10/14/2020 10:53:04 AM Confirm and document reason for call. If symptomatic, describe symptoms. ---Caller is having pain in chest, back and neck. Began in the chest; began on Sunday and is worsening. Does the patient have any new or worsening symptoms? ---Yes Will a triage be completed? ---Yes Related visit to physician within the last 2 weeks? ---No Does the PT have any chronic conditions? (i.e. diabetes, asthma, this includes High risk factors for pregnancy, etc.) ---Yes List chronic conditions. ---HTN; high chol; Is this a behavioral health or substance abuse call? ---No Guidelines Guideline Title Affirmed Question Affirmed Notes Nurse Date/Time (Eastern Time) Chest Pain Pain also in shoulder(s) or arm(s) or jaw (Exception: pain is clearly made worse by movement) Odis Luster, RN, Bjorn Loser 10/14/2020 10:54:27 AM Disp. Time Lamount Cohen Time) Disposition Final User 10/14/2020 10:51:08 AM Send to Urgent Queue Nonnie Done 10/14/2020 10:57:09 AM Go to ED Now Yes Odis Luster, RN, Juliene Pina Disagree/Comply Comply PLEASE NOTE: All timestamps contained within this report are represented as [place] Standard Time. CONFIDENTIALTY NOTICE: This fax transmission is intended only for the addressee. It contains information that is legally privileged, confidential or otherwise protected from use or disclosure. If you are not the intended recipient, you are strictly prohibited from reviewing, disclosing, copying using or disseminating any of this information or taking any action in reliance on or regarding this information. If you have received this fax in error, please notify us immediately by telephone so that we can arrange for its return to [place]. Phone: [phone], Toll-Free: [phone], Fax: [phone] Page: 2 of 2 Call Id: [id number] Caller Understands Yes PreDisposition InappropriateToAsk Care Advice Given Per Guideline GO TO ED NOW:  * You need to be seen in the Emergency Department. * Go to the ED at ___________ Hospital. * Leave now. Drive carefully. NOTE TO TRIAGER - DRIVING: * Another adult should drive. * Patient should not delay going to the emergency department. BRING MEDICINES: * Bring a list of your current medicines when you go to the Emergency Department (ER). * Bring the pill bottles too. This will help the doctor (or NP/PA) to make certain you are taking the right medicines and the right dose. CALL EMS IF: * Severe difficulty breathing occurs * Passes out or becomes too weak to stand * You become worse CARE ADVICE given per Chest Pain (Adult) guideline. * If immediate transportation is not available via car, rideshare (e.g., Lyft, Uber), or taxi, then the patient should be instructed to call [id number]. Referrals GO TO FACILITY UNDECIDE

## 2020-10-14 NOTE — ED Provider Notes (Signed)
[facility] EMERGENCY DEPARTMENT Provider Note   CSN: [id number] Arrival date & time: 10/14/20  1122     History Chief Complaint  Patient presents with  . Chest Pain    Phillip Scott is a 60 y.o. male.  HPI Patient is a 60 year old male presented today for chest pain that he states has been intermittent for the past month.  He states that it seems to come on with movements he states he also has some neck pain that is a separate area of pain left side of his neck.  He states it hurts when he moves his head to the right and a rotational movement.  He states that he has no exertional pain no pleuritic pain.  He denies any nausea, diaphoresis, lightheadedness, dizziness, shortness of breath.  He denies any other aggravating factors.  Finds that he did not have very much improvement with a muscle relaxer that he took.  He does not recall which also like to use.  He has not taken any Tylenol or ibuprofen as not used any warm compresses or any other interventions.  He states he has no medical issues apart from OSA, HTN and allergic rhinitis.  No history of coronary artery disease.  Is never been a smoker. No recent surgeries, hospitalization, long travel, hemoptysis, estrogen containing OCP, cancer history.  No unilateral leg swelling.  No history of PE or VTE.  HPI: A 60 year old patient with a history of hypertension presents for evaluation of chest pain. Initial onset of pain was more than 6 hours ago. The patient's chest pain is well-localized and is not worse with exertion. The patient's chest pain is middle- or left-sided, is not described as heaviness/pressure/tightness, is not sharp and does not radiate to the arms/jaw/neck. The patient does not complain of nausea and denies diaphoresis. The patient has no history of stroke, has no history of peripheral artery disease, has not smoked in the past 90 days, denies any history of treated diabetes, has no relevant family history  of coronary artery disease (first degree relative at less than age 92), has no history of hypercholesterolemia and does not have an elevated BMI (>=30).   Past Medical History:  Diagnosis Date  . Allergic rhinitis   . Hypertension     Patient Active Problem List   Diagnosis Date Noted  . Routine general medical examination at a health care facility 08/20/2014  . OSA (obstructive sleep apnea) 06/13/2013  . Hematuria, unspecified 06/18/2012  . Allergic rhinitis 11/05/2008  . Essential hypertension 04/24/2007    Past Surgical History:  Procedure Laterality Date  . KNEE SURGERY     x 2        Family History  Problem Relation Age of Onset  . Hypertension Father   . Hypertension Other        multiple family members    Social History   Tobacco Use  . Smoking status: Never Smoker  . Smokeless tobacco: Never Used  Substance Use Topics  . Alcohol use: No  . Drug use: No    Home Medications Prior to Admission medications   Medication Sig Start Date End Date Taking? Authorizing Provider  amLODipine (NORVASC) 5 MG tablet Take 1 tablet by mouth once daily Patient taking differently: Take 5 mg by mouth daily. 09/13/20  Yes Jarold Motto, PA  atorvastatin (LIPITOR) 20 MG tablet Take 1 tablet by mouth once daily Patient taking differently: Take 20 mg by mouth daily. 07/15/20  Yes Jarold Motto,  PA  cyclobenzaprine (FLEXERIL) 10 MG tablet Take 1 tablet (10 mg total) by mouth 3 (three) times daily as needed for muscle spasms. 05/14/20  Yes Ardith Dark, MD  diclofenac (VOLTAREN) 75 MG EC tablet Take 1 tablet (75 mg total) by mouth 2 (two) times daily. Patient taking differently: Take 75 mg by mouth 2 (two) times daily as needed for mild pain. 05/14/20  Yes Jarold Motto, PA  losartan (COZAAR) 100 MG tablet Take 1 tablet by mouth once daily Patient taking differently: Take 100 mg by mouth daily. 09/13/20  Yes Jarold Motto, PA  methocarbamol (ROBAXIN) 500 MG tablet Take  1 tablet (500 mg total) by mouth 2 (two) times daily. 10/14/20  Yes Zaya Kessenich S, PA  omeprazole (PRILOSEC) 20 MG capsule Take 1 capsule (20 mg total) by mouth daily. 10/14/20  Yes Chavis Tessler S, PA  tadalafil (CIALIS) 20 MG tablet Take 0.5-1 tablets (10-20 mg total) by mouth every other day as needed for erectile dysfunction. Patient not taking: No sig reported 03/04/19   Jarold Motto, PA    Allergies    Lisinopril  Review of Systems   Review of Systems  Constitutional: Negative for chills, diaphoresis and fever.  HENT: Negative for congestion.   Eyes: Negative for pain.  Respiratory: Negative for cough and shortness of breath.   Cardiovascular: Positive for chest pain (intermittent, none now). Negative for leg swelling.  Gastrointestinal: Negative for abdominal pain, diarrhea, nausea and vomiting.  Genitourinary: Negative for dysuria.  Musculoskeletal: Negative for myalgias.       Neck pain  Skin: Negative for rash.  Neurological: Negative for dizziness and headaches.    Physical Exam Updated Vital Signs BP (!) 150/104   Pulse 79   Temp 98.8 F (37.1 C)   Resp 12   SpO2 98%   Physical Exam Vitals and nursing note reviewed.  Constitutional:      General: He is not in acute distress. HENT:     Head: Normocephalic and atraumatic.     Nose: Nose normal.  Eyes:     General: No scleral icterus. Cardiovascular:     Rate and Rhythm: Normal rate and regular rhythm.     Pulses: Normal pulses.     Heart sounds: Normal heart sounds.  Pulmonary:     Effort: Pulmonary effort is normal. No respiratory distress.     Breath sounds: No wheezing.  Abdominal:     Palpations: Abdomen is soft.     Tenderness: There is no abdominal tenderness.  Musculoskeletal:     Cervical back: Normal range of motion.     Right lower leg: No edema.     Left lower leg: No edema.  Skin:    General: Skin is warm and dry.     Capillary Refill: Capillary refill takes less than 2 seconds.   Neurological:     Mental Status: He is alert. Mental status is at baseline.  Psychiatric:        Mood and Affect: Mood normal.        Behavior: Behavior normal.     ED Results / Procedures / Treatments   Labs (all labs ordered are listed, but only abnormal results are displayed) Labs Reviewed  CBC - Abnormal; Notable for the following components:      Result Value   WBC 3.8 (*)    All other components within normal limits  HEPATIC FUNCTION PANEL - Abnormal; Notable for the following components:   Total Bilirubin 2.1 (*)  Bilirubin, Direct 0.3 (*)    Indirect Bilirubin 1.8 (*)    All other components within normal limits  BASIC METABOLIC PANEL  LIPASE, BLOOD  TROPONIN I (HIGH SENSITIVITY)  TROPONIN I (HIGH SENSITIVITY)    EKG EKG Interpretation  Date/Time:  Thursday October 14 2020 11:29:20 EDT Ventricular Rate:  102 PR Interval:  162 QRS Duration: 78 QT Interval:  360 QTC Calculation: 469 R Axis:   5 Text Interpretation: Sinus tachycardia Otherwise normal ECG when compared to prior, faster rate. No STEMI Confirmed by Theda Belfast ([id number]) on 10/14/2020 3:58:10 PM   Radiology DG Chest 2 View  Result Date: 10/14/2020 CLINICAL DATA:  Chest pain EXAM: CHEST - 2 VIEW COMPARISON:  April 26, 2016 FINDINGS: Lungs are clear. Heart size and pulmonary vascularity are normal. No adenopathy. No pneumothorax. No bone lesions. IMPRESSION: Lungs clear.  Cardiac silhouette normal. Electronically Signed   By: Bretta Bang III M.D.   On: 10/14/2020 11:53    Procedures Procedures   Medications Ordered in ED Medications  famotidine (PEPCID) IVPB 20 mg premix (0 mg Intravenous Stopped 10/14/20 1756)  alum & mag hydroxide-simeth (MAALOX/MYLANTA) 200-200-20 MG/5ML suspension 30 mL (30 mLs Oral Given 10/14/20 2016)    And  lidocaine (XYLOCAINE) 2 % viscous mouth solution 15 mL (15 mLs Oral Given 10/14/20 2016)    ED Course  I have reviewed the triage vital signs and the  nursing notes.  Pertinent labs & imaging results that were available during my care of the patient were reviewed by me and considered in my medical decision making (see chart for details).    MDM Rules/Calculators/A&P HEAR Score: 3                        Patient is 60 year old male presented today with episodic chest pain for  Chest x-ray unremarkable.  EKG nonischemic. I personally reviewed all laboratory work and imaging. Troponin x2 within normal limits.  Lipase is normal at stopping denies pain Metabolic panel without any acute abnormality specifically kidney function within normal limits and no significant electrolyte abnormalities.  LFTs unremarkable mild elevated bilirubin will follow up with PCP for this CBC without leukocytosis or significant anemia.  Mild leukopenia appears to be chronic in nature  Doubt PE as patient denies pleuritic component of CP, denies history of clot, active cancer, immobilization, surgery, hemoptysis, known clotting disorder, is on no estrogen containing medications, denies calf pain and on physical exam has no unilateral leg swelling, calf TTP, tachycardia or hypoxia.   Doubt ACS as patient has HTN but no other significant risk factors (DM, HLD, obesity, CAD/CVA, family hx, smoking) risk factors. Patient's CP is atypical for ACS. Is non-radiating, substernal, not described as chest pressure and is not precipitated/worsened by exertion.  Doubt thoracic aortic dissection as pain lacks tearing or severe quality and does not radiate to back. Patient denies nausea, diaphoresis. Symmetric pulses, no murmur, no neurologic deficit. CXR without widened mediastinum.   Doubt pericarditis as no recent illness, PE without muffled heart sounds or friction rub, CP is non-pleuritic, EKG without global STE or PR depression.  Given patient's reassuring work-up will discharge home at this time.  Patient is notably PERC negative.  Doubt pulmonary embolism  Given reassurance  will discharge home with Robaxin and omeprazole and close follow-up with PCP strict return precautions.  Hear score of 3  Final Clinical Impression(s) / ED Diagnoses Final diagnoses:  Chest pain, unspecified type    Rx /  DC Orders ED Discharge Orders         Ordered    omeprazole (PRILOSEC) 20 MG capsule  Daily        10/14/20 1930    methocarbamol (ROBAXIN) 500 MG tablet  2 times daily        10/14/20 1930           Gailen ShelterFondaw, Declynn Lopresti S, [place] 10/15/20 0107    Tegeler, Canary Brimhristopher J, MD 10/16/20 0003

## 2020-10-15 ENCOUNTER — Other Ambulatory Visit: Payer: Self-pay | Admitting: Physician Assistant

## 2020-10-15 NOTE — Telephone Encounter (Signed)
Please schedule an appointment with Dr.Worley for further refills. 

## 2020-11-17 ENCOUNTER — Telehealth: Payer: Self-pay

## 2020-11-17 MED ORDER — AMLODIPINE BESYLATE 5 MG PO TABS: 1.0000 | ORAL_TABLET | Freq: Every day | ORAL | 0 refills | Status: DC

## 2020-11-17 NOTE — Telephone Encounter (Signed)
  LAST APPOINTMENT DATE: 04/2020   NEXT APPOINTMENT DATE:@5 /08/2020  MEDICATION:amLODipine (NORVASC) 5 MG tablet   PHARMACY:[facility] -- Address: [address], [place], [place] [address]  Comments: Patient is completely out.   Please advise

## 2020-11-17 NOTE — Telephone Encounter (Signed)
Spoke to pt told him Rx for Amlodipine was sent to pharmacy. Please make sure you keep your appt that is scheduled. Pt verbalized understanding.

## 2020-11-22 NOTE — Telephone Encounter (Signed)
Patient is calling in stating that he is completely out, and the pharmacy does not have the medication.

## 2020-11-23 MED ORDER — AMLODIPINE BESYLATE 5 MG PO TABS: 1.0000 | ORAL_TABLET | Freq: Every day | ORAL | 0 refills | Status: DC

## 2020-11-23 NOTE — Addendum Note (Signed)
Addended by: Jimmye Norman on: 11/23/2020 07:08 AM   Modules accepted: Orders

## 2020-11-23 NOTE — Telephone Encounter (Signed)
Rx sent to pharmacy again.  

## 2020-11-23 NOTE — Telephone Encounter (Signed)
Spoke to pt told him I resent Rx to the pharmacy and clarified pharmacy. Pt verbalized understanding.

## 2020-11-26 ENCOUNTER — Telehealth: Payer: Self-pay

## 2020-11-26 MED ORDER — ATORVASTATIN CALCIUM 20 MG PO TABS: 20.0000 mg | ORAL_TABLET | Freq: Every day | ORAL | 0 refills | Status: DC

## 2020-11-26 NOTE — Telephone Encounter (Signed)
Left message on voicemail Rx sent to pharmacy as requested. 

## 2020-11-26 NOTE — Telephone Encounter (Signed)
  LAST APPOINTMENT DATE: 11/17/2020   NEXT APPOINTMENT DATE:@5 /08/2020  MEDICATION:atorvastatin (LIPITOR) 20 MG tablet  PHARMACY:[facility] [address] [place], [place] - [address]  Comments:Patient is completely out and has been out all week.   Please advise

## 2020-11-29 ENCOUNTER — Ambulatory Visit: Payer: BC Managed Care – PPO | Admitting: Physician Assistant

## 2020-12-01 ENCOUNTER — Telehealth: Payer: Self-pay

## 2020-12-01 NOTE — Telephone Encounter (Signed)
Pt called asking if he can transfer care from [facility] to Dr. [name]. Ok to transfer? Please advise.

## 2020-12-01 NOTE — Telephone Encounter (Signed)
Ok with me 

## 2020-12-02 NOTE — Telephone Encounter (Signed)
Ok with me.   Phillip Scott. [name], MD 12/02/2020 12:50 PM

## 2020-12-07 ENCOUNTER — Telehealth: Payer: BC Managed Care – PPO | Admitting: Physician Assistant

## 2020-12-10 ENCOUNTER — Telehealth: Payer: BC Managed Care – PPO | Admitting: Physician Assistant

## 2020-12-15 ENCOUNTER — Encounter: Payer: Self-pay | Admitting: Family Medicine

## 2020-12-15 ENCOUNTER — Ambulatory Visit (INDEPENDENT_AMBULATORY_CARE_PROVIDER_SITE_OTHER): Payer: BC Managed Care – PPO | Admitting: Family Medicine

## 2020-12-15 ENCOUNTER — Other Ambulatory Visit: Payer: Self-pay

## 2020-12-15 VITALS — BP 136/80 | HR 76 | Temp 98.7°F | Ht 68.0 in | Wt 196.2 lb

## 2020-12-15 DIAGNOSIS — Z0001 Encounter for general adult medical examination with abnormal findings: Secondary | ICD-10-CM | POA: Diagnosis not present

## 2020-12-15 DIAGNOSIS — I10 Essential (primary) hypertension: Secondary | ICD-10-CM

## 2020-12-15 DIAGNOSIS — R739 Hyperglycemia, unspecified: Secondary | ICD-10-CM

## 2020-12-15 DIAGNOSIS — Z125 Encounter for screening for malignant neoplasm of prostate: Secondary | ICD-10-CM | POA: Diagnosis not present

## 2020-12-15 DIAGNOSIS — E785 Hyperlipidemia, unspecified: Secondary | ICD-10-CM

## 2020-12-15 LAB — CBC
HCT: 39.3 % (ref 39.0–52.0)
Hemoglobin: 13.8 g/dL (ref 13.0–17.0)
MCHC: 35.2 g/dL (ref 30.0–36.0)
MCV: 94 fl (ref 78.0–100.0)
Platelets: 183 10*3/uL (ref 150.0–400.0)
RBC: 4.18 Mil/uL — ABNORMAL LOW (ref 4.22–5.81)
RDW: 13.5 % (ref 11.5–15.5)
WBC: 3.2 10*3/uL — ABNORMAL LOW (ref 4.0–10.5)

## 2020-12-15 LAB — COMPREHENSIVE METABOLIC PANEL
ALT: 25 U/L (ref 0–53)
AST: 22 U/L (ref 0–37)
Albumin: 4.4 g/dL (ref 3.5–5.2)
Alkaline Phosphatase: 48 U/L (ref 39–117)
BUN: 25 mg/dL — ABNORMAL HIGH (ref 6–23)
CO2: 25 mEq/L (ref 19–32)
Calcium: 9.3 mg/dL (ref 8.4–10.5)
Chloride: 107 mEq/L (ref 96–112)
Creatinine, Ser: 1.39 mg/dL (ref 0.40–1.50)
GFR: 54.67 mL/min — ABNORMAL LOW (ref >60.00)
Glucose, Bld: 87 mg/dL (ref 70–99)
Potassium: 4.2 mEq/L (ref 3.5–5.1)
Sodium: 141 mEq/L (ref 135–145)
Total Bilirubin: 1.4 mg/dL — ABNORMAL HIGH (ref 0.2–1.2)
Total Protein: 7.2 g/dL (ref 6.0–8.3)

## 2020-12-15 LAB — TSH: TSH: 1.33 u[IU]/mL (ref 0.35–4.50)

## 2020-12-15 LAB — LIPID PANEL
Cholesterol: 119 mg/dL (ref 0–200)
HDL: 42.4 mg/dL (ref >39.00)
LDL Cholesterol: 54 mg/dL (ref 0–99)
NonHDL: 76.34
Total CHOL/HDL Ratio: 3
Triglycerides: 114 mg/dL (ref 0.0–149.0)
VLDL: 22.8 mg/dL (ref 0.0–40.0)

## 2020-12-15 LAB — PSA: PSA: 0.65 ng/mL (ref 0.10–4.00)

## 2020-12-15 LAB — HEMOGLOBIN A1C: Hgb A1c MFr Bld: 6.1 % (ref 4.6–6.5)

## 2020-12-15 MED ORDER — LOSARTAN POTASSIUM 100 MG PO TABS: 100.0000 mg | ORAL_TABLET | Freq: Every day | ORAL | 0 refills | Status: DC

## 2020-12-15 NOTE — Progress Notes (Signed)
Chief Complaint:  Phillip Scott is a 60 y.o. male who presents today for his annual comprehensive physical exam.    Assessment/Plan:  Chronic Problems Addressed Today: Essential hypertension At goal on amlodipine 5 mg daily and losartan 100 mg daily.  Dyslipidemia On Lipitor 20 mg daily.  We will check lipid panel and other labs today.  Body mass index is 29.83 kg/m. Phillip Scott  BMI Metric Follow Up - 12/15/20 1108      BMI Metric Follow Up-Please document annually   BMI Metric Follow Up Education provided           Preventative Healthcare: Check labs today.  Due for colonoscopy in 2024.  Up-to-date on vaccines.  Patient Counseling(The following topics were reviewed and/or handout was given):  -Nutrition: Stressed importance of moderation in sodium/caffeine intake, saturated fat and cholesterol, caloric balance, sufficient intake of fresh fruits, vegetables, and fiber.  -Stressed the importance of regular exercise.   -Substance Abuse: Discussed cessation/primary prevention of tobacco, alcohol, or other drug use; driving or other dangerous activities under the influence; availability of treatment for abuse.   -Injury prevention: Discussed safety belts, safety helmets, smoke detector, smoking near bedding or upholstery.   -Sexuality: Discussed sexually transmitted diseases, partner selection, use of condoms, avoidance of unintended pregnancy and contraceptive alternatives.   -Dental health: Discussed importance of regular tooth brushing, flossing, and dental visits.  -Health maintenance and immunizations reviewed. Please refer to Health maintenance section.  Return to care in 1 year for next preventative visit.     Subjective:  HPI:  He has no acute complaints today.   Lifestyle Diet: Balanced.  Plenty of fruits and vegetables. Exercise: Busy with work.  Depression screen PHQ 2/9 05/14/2020  Decreased Interest 2  Down, Depressed, Hopeless 0  PHQ - 2 Score 2   Altered sleeping 2  Tired, decreased energy 2  Change in appetite 0  Feeling bad or failure about yourself  0  Trouble concentrating 1  Moving slowly or fidgety/restless 0  Suicidal thoughts 0  PHQ-9 Score 7  Difficult doing work/chores Not difficult at all   ROS: Per HPI, otherwise a complete review of systems was negative.   PMH:  The following were reviewed and entered/updated in epic: Past Medical History:  Diagnosis Date  . Allergic rhinitis   . Hypertension    Patient Active Problem List   Diagnosis Date Noted  . Dyslipidemia 12/15/2020  . OSA (obstructive sleep apnea) 06/13/2013  . Hematuria, unspecified 06/18/2012  . Allergic rhinitis 11/05/2008  . Essential hypertension 04/24/2007   Past Surgical History:  Procedure Laterality Date  . KNEE SURGERY     x 2     Family History  Problem Relation Age of Onset  . Hypertension Father   . Hypertension Other        multiple family members    Medications- reviewed and updated Current Outpatient Medications  Medication Sig Dispense Refill  . amLODipine (NORVASC) 5 MG tablet Take 1 tablet (5 mg total) by mouth daily. 90 tablet 0  . atorvastatin (LIPITOR) 20 MG tablet Take 1 tablet (20 mg total) by mouth daily. 30 tablet 0  . methocarbamol (ROBAXIN) 500 MG tablet Take 1 tablet (500 mg total) by mouth 2 (two) times daily. 20 tablet 0  . losartan (COZAAR) 100 MG tablet Take 1 tablet (100 mg total) by mouth daily. 90 tablet 0   No current facility-administered medications for this visit.    Allergies-reviewed and updated Allergies  Allergen  Reactions  . Lisinopril Cough    Social History   Socioeconomic History  . Marital status: Married    Spouse name: Not on file  . Number of children: Not on file  . Years of education: Not on file  . Highest education level: Not on file  Occupational History  . Occupation: Housekeeper  Tobacco Use  . Smoking status: Never Smoker  . Smokeless tobacco: Never Used   Substance and Sexual Activity  . Alcohol use: No  . Drug use: No  . Sexual activity: Not on file  Other Topics Concern  . Not on file  Social History Narrative  . Not on file   Social Determinants of Health   Financial Resource Strain: Not on file  Food Insecurity: Not on file  Transportation Needs: Not on file  Physical Activity: Not on file  Stress: Not on file  Social Connections: Not on file        Objective:  Physical Exam: BP 136/80   Pulse 76   Temp 98.7 F (37.1 C) (Temporal)   Ht 5\' 8"  (1.727 m)   Wt 196 lb 3.2 oz (89 kg)   SpO2 96%   BMI 29.83 kg/m   Body mass index is 29.83 kg/m. Wt Readings from Last 3 Encounters:  12/15/20 196 lb 3.2 oz (89 kg)  05/14/20 192 lb 9.6 oz (87.4 kg)  03/18/20 191 lb 12.8 oz (87 kg)   Gen: NAD, resting comfortably HEENT: TMs normal bilaterally. OP clear. No thyromegaly noted.  CV: RRR with no murmurs appreciated Pulm: NWOB, CTAB with no crackles, wheezes, or rhonchi GI: Normal bowel sounds present. Soft, Nontender, Nondistended. MSK: no edema, cyanosis, or clubbing noted Skin: warm, dry Neuro: CN2-12 grossly intact. Strength 5/5 in upper and lower extremities. Reflexes symmetric and intact bilaterally.  Psych: Normal affect and thought content     Phillip Scott M. 03/20/20, MD 12/15/2020 11:09 AM

## 2020-12-15 NOTE — Assessment & Plan Note (Signed)
On Lipitor 20 mg daily.  We will check lipid panel and other labs today.

## 2020-12-15 NOTE — Assessment & Plan Note (Signed)
At goal on amlodipine 5 mg daily and losartan 100 mg daily.

## 2020-12-15 NOTE — Patient Instructions (Signed)
It was very nice to see you today!  We will check blood work today.  I will refill your meds.  We will see back in year.  Come back to see me sooner if needed.  Take care, Dr [name]  PLEASE NOTE:  If you had any lab tests please let us know if you have not heard back within a few days. You may see your results on mychart before we have a chance to review them but we will give you a call once they are reviewed by [place]. If we ordered any referrals today, please let us know if you have not heard from their office within the next week.   Please try these tips to maintain a healthy lifestyle:   Eat at least 3 REAL meals and 1-2 snacks per day.  Aim for no more than 5 hours between eating.  If you eat breakfast, please do so within one hour of getting up.    Each meal should contain half fruits/vegetables, one quarter protein, and one quarter carbs (no bigger than a computer mouse)   Cut down on sweet beverages. This includes juice, soda, and sweet tea.     Drink at least 1 glass of water with each meal and aim for at least 8 glasses per day   Exercise at least 150 minutes every week.    Preventive Care 31-13 Years Old, Male Preventive care refers to lifestyle choices and visits with your health care provider that can promote health and wellness. This includes:  A yearly physical exam. This is also called an annual wellness visit.  Regular dental and eye exams.  Immunizations.  Screening for certain conditions.  Healthy lifestyle choices, such as: ? Eating a healthy diet. ? Getting regular exercise. ? Not using drugs or products that contain nicotine and tobacco. ? Limiting alcohol use. What can I expect for my preventive care visit? Physical exam Your health care provider will check your:  Height and weight. These may be used to calculate your BMI (body mass index). BMI is a measurement that tells if you are at a healthy weight.  Heart rate and blood pressure.  Body  temperature.  Skin for abnormal spots. Counseling Your health care provider may ask you questions about your:  Past medical problems.  Family's medical history.  Alcohol, tobacco, and drug use.  Emotional well-being.  Home life and relationship well-being.  Sexual activity.  Diet, exercise, and sleep habits.  Work and work Astronomer.  Access to firearms. What immunizations do I need? Vaccines are usually given at various ages, according to a schedule. Your health care provider will recommend vaccines for you based on your age, medical history, and lifestyle or other factors, such as travel or where you work.   What tests do I need? Blood tests  Lipid and cholesterol levels. These may be checked every 5 years, or more often if you are over 54 years old.  Hepatitis C test.  Hepatitis B test. Screening  Lung cancer screening. You may have this screening every year starting at age 75 if you have a 30-pack-year history of smoking and currently smoke or have quit within the past 15 years.  Prostate cancer screening. Recommendations will vary depending on your family history and other risks.  Genital exam to check for testicular cancer or hernias.  Colorectal cancer screening. ? All adults should have this screening starting at age 66 and continuing until age 57. ? Your health care provider may recommend  screening at age 55 if you are at increased risk. ? You will have tests every 1-10 years, depending on your results and the type of screening test.  Diabetes screening. ? This is done by checking your blood sugar (glucose) after you have not eaten for a while (fasting). ? You may have this done every 1-3 years.  STD (sexually transmitted disease) testing, if you are at risk. Follow these instructions at home: Eating and drinking  Eat a diet that includes fresh fruits and vegetables, whole grains, lean protein, and low-fat dairy products.  Take vitamin and mineral  supplements as recommended by your health care provider.  Do not drink alcohol if your health care provider tells you not to drink.  If you drink alcohol: ? Limit how much you have to 0-2 drinks a day. ? Be aware of how much alcohol is in your drink. In the [place] one drink equals one 12 oz bottle of beer (355 mL), one 5 oz glass of wine (148 mL), or one 1 oz glass of hard liquor (44 mL).   Lifestyle  Take daily care of your teeth and gums. Brush your teeth every morning and night with fluoride toothpaste. Floss one time each day.  Stay active. Exercise for at least 30 minutes 5 or more days each week.  Do not use any products that contain nicotine or tobacco, such as cigarettes, e-cigarettes, and chewing tobacco. If you need help quitting, ask your health care provider.  Do not use drugs.  If you are sexually active, practice safe sex. Use a condom or other form of protection to prevent STIs (sexually transmitted infections).  If told by your health care provider, take low-dose aspirin daily starting at age 107.  Find healthy ways to cope with stress, such as: ? Meditation, yoga, or listening to music. ? Journaling. ? Talking to a trusted person. ? Spending time with friends and family. Safety  Always wear your seat belt while driving or riding in a vehicle.  Do not drive: ? If you have been drinking alcohol. Do not ride with someone who has been drinking. ? When you are tired or distracted. ? While texting.  Wear a helmet and other protective equipment during sports activities.  If you have firearms in your house, make sure you follow all gun safety procedures. What's next?  Go to your health care provider once a year for an annual wellness visit.  Ask your health care provider how often you should have your eyes and teeth checked.  Stay up to date on all vaccines. This information is not intended to replace advice given to you by your health care provider. Make sure you  discuss any questions you have with your health care provider. Document Revised: 04/15/2019 Document Reviewed: 07/11/2018 Elsevier Patient Education  2021 [employer].

## 2020-12-15 NOTE — Addendum Note (Signed)
Addended by: Dyann Kief on: 12/15/2020 11:12 AM   Modules accepted: Orders

## 2020-12-17 ENCOUNTER — Encounter: Payer: Self-pay | Admitting: Family Medicine

## 2020-12-17 DIAGNOSIS — R739 Hyperglycemia, unspecified: Secondary | ICD-10-CM | POA: Insufficient documentation

## 2020-12-17 NOTE — Progress Notes (Signed)
Please inform patient of the following:  Good news!.  His cholesterol looks much better.  Would like for him to continue Lipitor 20 mg daily.  His blood sugar is borderline diabetic.  Do not need to make any other changes to treatment plan but he should continue working on diet and exercise we can recheck in a year or so.

## 2020-12-29 ENCOUNTER — Other Ambulatory Visit: Payer: Self-pay | Admitting: Physician Assistant

## 2021-03-18 ENCOUNTER — Other Ambulatory Visit: Payer: Self-pay | Admitting: Family Medicine

## 2021-03-18 ENCOUNTER — Other Ambulatory Visit: Payer: Self-pay | Admitting: Physician Assistant

## 2021-05-25 ENCOUNTER — Other Ambulatory Visit: Payer: Self-pay

## 2021-05-25 ENCOUNTER — Ambulatory Visit: Payer: BC Managed Care – PPO | Admitting: Physician Assistant

## 2021-05-25 ENCOUNTER — Encounter: Payer: Self-pay | Admitting: Physician Assistant

## 2021-05-25 VITALS — BP 160/90 | HR 69 | Temp 98.7°F | Ht 68.0 in | Wt 191.2 lb

## 2021-05-25 DIAGNOSIS — Z23 Encounter for immunization: Secondary | ICD-10-CM

## 2021-05-25 DIAGNOSIS — I10 Essential (primary) hypertension: Secondary | ICD-10-CM

## 2021-05-25 DIAGNOSIS — M545 Low back pain, unspecified: Secondary | ICD-10-CM | POA: Diagnosis not present

## 2021-05-25 LAB — COMPREHENSIVE METABOLIC PANEL
ALT: 19 U/L (ref 0–53)
AST: 25 U/L (ref 0–37)
Albumin: 4.4 g/dL (ref 3.5–5.2)
Alkaline Phosphatase: 47 U/L (ref 39–117)
BUN: 16 mg/dL (ref 6–23)
CO2: 28 mEq/L (ref 19–32)
Calcium: 9.4 mg/dL (ref 8.4–10.5)
Chloride: 105 mEq/L (ref 96–112)
Creatinine, Ser: 1.21 mg/dL (ref 0.40–1.50)
GFR: 64.36 mL/min (ref >60.00)
Glucose, Bld: 89 mg/dL (ref 70–99)
Potassium: 4.1 mEq/L (ref 3.5–5.1)
Sodium: 139 mEq/L (ref 135–145)
Total Bilirubin: 1.5 mg/dL — ABNORMAL HIGH (ref 0.2–1.2)
Total Protein: 7.6 g/dL (ref 6.0–8.3)

## 2021-05-25 MED ORDER — LOSARTAN POTASSIUM 100 MG PO TABS: ORAL_TABLET | ORAL | 0 refills | Status: DC

## 2021-05-25 MED ORDER — PREDNISONE 20 MG PO TABS: 40.0000 mg | ORAL_TABLET | Freq: Every day | ORAL | 0 refills | Status: DC

## 2021-05-25 NOTE — Progress Notes (Signed)
Phillip Scott is a 60 y.o. male here for back pain.  History of Present Illness:   Chief Complaint  Patient presents with   Back Pain    Pt c/o pain right lower back radiates into right hip x 3 weeks.     HPI  Back Pain Phillip Scott presents with c/o low right sided back pain that radiates into his right hip. He has been experiencing this pain for three weeks and states he believes the pain has stayed at a steady level. According to Mikal, he has a hx of muscle spasms and regular back pain, but he believed this to be related to his kidneys so he came in.  Pt expresses feeling this pain the most when he is bending over or palpation to the affected area. Usually can't sleep unless he is laying on left side. He has tries applying heat and taking ibuprofen once which provided minor relief.  Denies urinary changes, hematuria, numbness/tingling down leg, no pain lifting legs, or bowel incontinence.   Hypertension Currently compliant with losartan 100 mg and norvasc 5 mg with no adverse effects. States he believes his BP is elevated today due to being in pain.   BP Readings from Last 3 Encounters:  05/25/21 (!) 160/90  12/15/20 136/80  10/14/20 (!) 150/104     Past Medical History:  Diagnosis Date   Allergic rhinitis    Hypertension      Social History   Tobacco Use   Smoking status: Never   Smokeless tobacco: Never  Substance Use Topics   Alcohol use: No   Drug use: No    Past Surgical History:  Procedure Laterality Date   KNEE SURGERY     x 2     Family History  Problem Relation Age of Onset   Hypertension Father    Hypertension Other        multiple family members    Allergies  Allergen Reactions   Lisinopril Cough    Current Medications:   Current Outpatient Medications:    amLODipine (NORVASC) 5 MG tablet, Take 1 tablet by mouth once daily, Disp: 90 tablet, Rfl: 0   atorvastatin (LIPITOR) 20 MG tablet, Take 1 tablet by mouth once daily, Disp: 30 tablet,  Rfl: 5   losartan (COZAAR) 100 MG tablet, TAKE 1 TABLET BY MOUTH ONCE DAILY . APPOINTMENT REQUIRED FOR FUTURE REFILLS, Disp: 90 tablet, Rfl: 0   Review of Systems:   ROS Negative unless otherwise specified per HPI. Vitals:   Vitals:   05/25/21 1040  BP: (!) 160/90  Pulse: 69  Temp: 98.7 F (37.1 C)  TempSrc: Temporal  SpO2: 97%  Weight: 191 lb 4 oz (86.8 kg)  Height: 5\' 8"  (1.727 m)     Body mass index is 29.08 kg/m.  Physical Exam:   Physical Exam Vitals and nursing note reviewed.  Constitutional:      General: He is not in acute distress.    Appearance: He is well-developed. He is not ill-appearing or toxic-appearing.  Cardiovascular:     Rate and Rhythm: Normal rate and regular rhythm.     Pulses: Normal pulses.     Heart sounds: Normal heart sounds, S1 normal and S2 normal.  Pulmonary:     Effort: Pulmonary effort is normal.     Breath sounds: Normal breath sounds.  Musculoskeletal:     Lumbar back: Tenderness present.     Comments: No decreased ROM 2/2 pain with flexion/extension, lateral side bends, or rotation. Reproducible  tenderness with deep palpation to bilateral paraspinal muscles. No bony tenderness. No evidence of erythema, rash or ecchymosis. Negative STLR bilaterally.   Skin:    General: Skin is warm and dry.  Neurological:     Mental Status: He is alert.     GCS: GCS eye subscore is 4. GCS verbal subscore is 5. GCS motor subscore is 6.  Psychiatric:        Speech: Speech normal.        Behavior: Behavior normal. Behavior is cooperative.    Assessment and Plan:   Essential hypertension Uncontrolled today, suspect due to pain Update blood work to recheck kidney function Continue norvasc 5 mg daily and losartan 100 mg daily Continue to monitor BP Recommend follow-up with PCP in 3 months, sooner if concerns or BP >150/90 consistently  Acute right-sided low back pain without sciatica Suspect low back strain No red flags on exam Trial oral  prednisone, salon pas patches, stretches If lack of improvement or worsening after two weeks, patient was instructed to let us know  I,Havlyn C Ratchford,acting as a scribe for [employer], PA.,have documented all relevant documentation on the behalf of Jarold Motto, PA,as directed by  Jarold Motto, PA while in the presence of Jarold Motto, [place].  I, Jarold Motto, [place], have reviewed all documentation for this visit. The documentation on 05/25/21 for the exam, diagnosis, procedures, and orders are all accurate and complete.  Jarold Motto, PA-C

## 2021-05-25 NOTE — Patient Instructions (Signed)
It was great to see you!  Update blood work today and I will refill your blood pressure medication.  Oral prednisone for your back pain -- you do not need to take ibuprofen while on this  Trial the patches below:    Follow-up with Dr [name] in 3 months to recheck your blood pressure  Follow-up with [place] if your back pain does not improve in next two weeks or any worsening  Take care,  Jarold Motto PA-C

## 2021-05-27 ENCOUNTER — Telehealth: Payer: Self-pay | Admitting: *Deleted

## 2021-05-27 NOTE — Telephone Encounter (Signed)
Pt calling about lab results from 10/26. Asked pt did you not read your My chart message Samantha sent you cause it says you did? Pt said no must of been his wife. Told pt Lelon Mast said All of your results have returned and are overall normal. No further recommendations at this time. Pt verbalized understanding.

## 2021-06-08 ENCOUNTER — Ambulatory Visit (INDEPENDENT_AMBULATORY_CARE_PROVIDER_SITE_OTHER)
Admission: RE | Admit: 2021-06-08 | Discharge: 2021-06-08 | Disposition: A | Discharge status: Home / Self Care | Payer: BC Managed Care – PPO | Source: Ambulatory Visit | Attending: Family Medicine | Admitting: Family Medicine

## 2021-06-08 ENCOUNTER — Other Ambulatory Visit: Payer: Self-pay

## 2021-06-08 ENCOUNTER — Encounter: Payer: Self-pay | Admitting: Family Medicine

## 2021-06-08 ENCOUNTER — Ambulatory Visit (INDEPENDENT_AMBULATORY_CARE_PROVIDER_SITE_OTHER): Payer: BC Managed Care – PPO | Admitting: Family Medicine

## 2021-06-08 VITALS — BP 147/87 | HR 58 | Temp 98.5°F | Wt 191.2 lb

## 2021-06-08 DIAGNOSIS — I10 Essential (primary) hypertension: Secondary | ICD-10-CM

## 2021-06-08 DIAGNOSIS — R739 Hyperglycemia, unspecified: Secondary | ICD-10-CM

## 2021-06-08 DIAGNOSIS — R109 Unspecified abdominal pain: Secondary | ICD-10-CM | POA: Diagnosis not present

## 2021-06-08 DIAGNOSIS — M79641 Pain in right hand: Secondary | ICD-10-CM

## 2021-06-08 MED ORDER — PREDNISONE 50 MG PO TABS: ORAL_TABLET | ORAL | 0 refills | Status: DC

## 2021-06-08 MED ORDER — CYCLOBENZAPRINE HCL 10 MG PO TABS: 10.0000 mg | ORAL_TABLET | Freq: Three times a day (TID) | ORAL | 0 refills | Status: DC | PRN

## 2021-06-08 NOTE — Assessment & Plan Note (Signed)
At goal per JNC 8 on amlodipine 5 mg daily and losartan 100 mg daily.

## 2021-06-08 NOTE — Patient Instructions (Signed)
It was very nice to see you today!  Please start the prednisone and Flexeril.  We will check x-rays.  Let me know if your symptoms or not improving over the next several days.  Take care, Dr [name]  PLEASE NOTE:  If you had any lab tests please let us know if you have not heard back within a few days. You may see your results on mychart before we have a chance to review them but we will give you a call once they are reviewed by [place]. If we ordered any referrals today, please let us know if you have not heard from their office within the next week.   Please try these tips to maintain a healthy lifestyle:  Eat at least 3 REAL meals and 1-2 snacks per day.  Aim for no more than 5 hours between eating.  If you eat breakfast, please do so within one hour of getting up.   Each meal should contain half fruits/vegetables, one quarter protein, and one quarter carbs (no bigger than a computer mouse)  Cut down on sweet beverages. This includes juice, soda, and sweet tea.   Drink at least 1 glass of water with each meal and aim for at least 8 glasses per day  Exercise at least 150 minutes every week.

## 2021-06-08 NOTE — Assessment & Plan Note (Signed)
Check A1c next blood draw.  Doubt prednisone will have significant effect.
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
## 2021-06-10 NOTE — Progress Notes (Signed)
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 His pain should improve over the next several days. Would like for him to let us know if symptoms are not improving.  Katina Degree. [name], MD 06/10/2021 7:55 AM

## 2021-06-13 ENCOUNTER — Telehealth: Payer: Self-pay

## 2021-06-13 NOTE — Telephone Encounter (Signed)
Pt called regarding X-ray results. Please Advise.

## 2021-06-15 NOTE — Telephone Encounter (Signed)
Results given, see results note  

## 2021-06-17 ENCOUNTER — Other Ambulatory Visit: Payer: Self-pay

## 2021-06-17 DIAGNOSIS — M549 Dorsalgia, unspecified: Secondary | ICD-10-CM

## 2021-07-11 ENCOUNTER — Other Ambulatory Visit: Payer: Self-pay | Admitting: Family Medicine

## 2021-08-12 ENCOUNTER — Other Ambulatory Visit: Payer: Self-pay | Admitting: Family Medicine

## 2021-10-05 ENCOUNTER — Other Ambulatory Visit: Payer: Self-pay | Admitting: Physician Assistant

## 2021-11-29 ENCOUNTER — Other Ambulatory Visit: Payer: Self-pay | Admitting: Family Medicine

## 2022-01-09 ENCOUNTER — Other Ambulatory Visit: Payer: Self-pay | Admitting: Family Medicine

## 2022-01-15 ENCOUNTER — Other Ambulatory Visit: Payer: Self-pay | Admitting: Family Medicine

## 2022-02-08 ENCOUNTER — Other Ambulatory Visit: Payer: Self-pay | Admitting: Family Medicine

## 2022-03-25 ENCOUNTER — Other Ambulatory Visit: Payer: Self-pay | Admitting: Family Medicine

## 2022-04-23 ENCOUNTER — Other Ambulatory Visit: Payer: Self-pay | Admitting: Family Medicine

## 2022-04-24 ENCOUNTER — Encounter: Payer: Self-pay | Admitting: *Deleted

## 2022-05-27 ENCOUNTER — Other Ambulatory Visit: Payer: Self-pay | Admitting: Family Medicine

## 2022-06-05 ENCOUNTER — Encounter: Payer: Self-pay | Admitting: Family Medicine

## 2022-06-05 ENCOUNTER — Ambulatory Visit: Payer: BC Managed Care – PPO | Admitting: Family Medicine

## 2022-06-05 VITALS — BP 168/84 | HR 65 | Temp 97.8°F | Ht 68.0 in | Wt 197.4 lb

## 2022-06-05 DIAGNOSIS — I10 Essential (primary) hypertension: Secondary | ICD-10-CM | POA: Diagnosis not present

## 2022-06-05 DIAGNOSIS — E785 Hyperlipidemia, unspecified: Secondary | ICD-10-CM | POA: Diagnosis not present

## 2022-06-05 DIAGNOSIS — R739 Hyperglycemia, unspecified: Secondary | ICD-10-CM | POA: Diagnosis not present

## 2022-06-05 DIAGNOSIS — R3589 Other polyuria: Secondary | ICD-10-CM

## 2022-06-05 DIAGNOSIS — Z23 Encounter for immunization: Secondary | ICD-10-CM

## 2022-06-05 DIAGNOSIS — M545 Low back pain, unspecified: Secondary | ICD-10-CM

## 2022-06-05 LAB — URINALYSIS, ROUTINE W REFLEX MICROSCOPIC
Bilirubin Urine: NEGATIVE
Hgb urine dipstick: NEGATIVE
Ketones, ur: NEGATIVE
Leukocytes,Ua: NEGATIVE
Nitrite: NEGATIVE
RBC / HPF: NONE SEEN (ref 0–?)
Specific Gravity, Urine: 1.02 (ref 1.000–1.030)
Total Protein, Urine: NEGATIVE
Urine Glucose: NEGATIVE
Urobilinogen, UA: 0.2 (ref 0.0–1.0)
pH: 6 (ref 5.0–8.0)

## 2022-06-05 LAB — LIPID PANEL
Cholesterol: 104 mg/dL (ref 0–200)
HDL: 42.3 mg/dL (ref >39.00)
LDL Cholesterol: 46 mg/dL (ref 0–99)
NonHDL: 61.72
Total CHOL/HDL Ratio: 2
Triglycerides: 81 mg/dL (ref 0.0–149.0)
VLDL: 16.2 mg/dL (ref 0.0–40.0)

## 2022-06-05 LAB — CBC
HCT: 42.8 % (ref 39.0–52.0)
Hemoglobin: 14.5 g/dL (ref 13.0–17.0)
MCHC: 33.8 g/dL (ref 30.0–36.0)
MCV: 94.3 fl (ref 78.0–100.0)
Platelets: 158 10*3/uL (ref 150.0–400.0)
RBC: 4.54 Mil/uL (ref 4.22–5.81)
RDW: 14.2 % (ref 11.5–15.5)
WBC: 3.2 10*3/uL — ABNORMAL LOW (ref 4.0–10.5)

## 2022-06-05 LAB — COMPREHENSIVE METABOLIC PANEL
ALT: 25 U/L (ref 0–53)
AST: 33 U/L (ref 0–37)
Albumin: 4.3 g/dL (ref 3.5–5.2)
Alkaline Phosphatase: 48 U/L (ref 39–117)
BUN: 19 mg/dL (ref 6–23)
CO2: 29 mEq/L (ref 19–32)
Calcium: 9.2 mg/dL (ref 8.4–10.5)
Chloride: 104 mEq/L (ref 96–112)
Creatinine, Ser: 1.2 mg/dL (ref 0.40–1.50)
GFR: 64.54 mL/min (ref >60.00)
Glucose, Bld: 106 mg/dL — ABNORMAL HIGH (ref 70–99)
Potassium: 4 mEq/L (ref 3.5–5.1)
Sodium: 139 mEq/L (ref 135–145)
Total Bilirubin: 1.2 mg/dL (ref 0.2–1.2)
Total Protein: 7.1 g/dL (ref 6.0–8.3)

## 2022-06-05 LAB — TSH: TSH: 2.36 u[IU]/mL (ref 0.35–5.50)

## 2022-06-05 LAB — PSA: PSA: 0.63 ng/mL (ref 0.10–4.00)

## 2022-06-05 LAB — HEMOGLOBIN A1C: Hgb A1c MFr Bld: 6.3 % (ref 4.6–6.5)

## 2022-06-05 NOTE — Progress Notes (Signed)
   Phillip Scott is a 61 y.o. male who presents today for an office visit.  Assessment/Plan:  New/Acute Problems: Polyuria Likely due to BPH.  We will check labs today including CBC, c-Met, TSH, A1c, urinalysis, and urine culture to rule out other possible causes.  Also check PSA.  Depending on results would consider trial of Flomax.  Chronic Problems Addressed Today: Dyslipidemia Check lipids.  He is on Lipitor 20 mg daily.  Hyperglycemia Check A1c.  Low back pain No red flags.  This has been persistent for the last year or so. Had an x-ray last year that showed some degenerative changes. We did discuss referral to PT or sports medicine however he deferred.  We discussed home exercise program.  He will let me know if not improving.  Can use over-the-counter meds as needed.  Essential hypertension Elevated today though he has been out of medications for a few days.  He has been previously well controlled.  We will continue amlodipine 5 mg daily and losartan 100 mg daily.  He will continue to monitor at home and follow-up with [place] in a couple of weeks.     Subjective:  HPI:  See A/p for status of chronic conditions.  His main concern today is polyuria.  This has been more the last few weeks. No dysuria. No fevers or chills. Some dribbling.  No difficulty with urination.  No nocturia.       Objective:  Physical Exam: BP (!) 168/84   Pulse 65   Temp 97.8 F (36.6 C) (Temporal)   Ht _0  (1.727 m)   Wt 197 lb 6.4 oz (89.5 kg)   SpO2 97%   BMI 30.01 kg/m   Gen: No acute distress, resting comfortably CV: Regular rate and rhythm with no murmurs appreciated Pulm: Normal work of breathing, clear to auscultation bilaterally with no crackles, wheezes, or rhonchi Neuro: Grossly normal, moves all extremities Psych: Normal affect and thought content      Xandra Laramee M. Jerline Pain, MD 06/05/2022 8:14 AM

## 2022-06-05 NOTE — Patient Instructions (Signed)
It was very nice to see you today!  You probably have an enlarged prostate.  We will check blood work and a urine sample today to make sure there is nothing else is going on.  Please keep an eye on your blood pressure and let us know if it is persistently elevated to 140/90 or higher.  We will give your flu shot today.  Work on the exercises for your side pain.  Let me know if not improving.  Take care, Dr Jerline Pain  PLEASE NOTE:  If you had any lab tests please let us know if you have not heard back within a few days. You may see your results on mychart before we have a chance to review them but we will give you a call once they are reviewed by [place]. If we ordered any referrals today, please let us know if you have not heard from their office within the next week.   Please try these tips to maintain a healthy lifestyle:  Eat at least 3 REAL meals and 1-2 snacks per day.  Aim for no more than 5 hours between eating.  If you eat breakfast, please do so within one hour of getting up.   Each meal should contain half fruits/vegetables, one quarter protein, and one quarter carbs (no bigger than a computer mouse)  Cut down on sweet beverages. This includes juice, soda, and sweet tea.   Drink at least 1 glass of water with each meal and aim for at least 8 glasses per day  Exercise at least 150 minutes every week.

## 2022-06-05 NOTE — Assessment & Plan Note (Signed)
No red flags.  This has been persistent for the last year or so. Had an x-ray last year that showed some degenerative changes. We did discuss referral to PT or sports medicine however he deferred.  We discussed home exercise program.  He will let me know if not improving.  Can use over-the-counter meds as needed.

## 2022-06-05 NOTE — Assessment & Plan Note (Signed)
Elevated today though he has been out of medications for a few days.  He has been previously well controlled.  We will continue amlodipine 5 mg daily and losartan 100 mg daily.  He will continue to monitor at home and follow-up with [place] in a couple of weeks.

## 2022-06-05 NOTE — Assessment & Plan Note (Signed)
Check lipids.  He is on Lipitor 20 mg daily.

## 2022-06-05 NOTE — Assessment & Plan Note (Signed)
Check A1c. 

## 2022-06-06 ENCOUNTER — Telehealth: Payer: Self-pay | Admitting: Family Medicine

## 2022-06-06 LAB — URINE CULTURE
MICRO NUMBER:: 14149474
Result:: NO GROWTH
SPECIMEN QUALITY:: ADEQUATE

## 2022-06-06 NOTE — Telephone Encounter (Signed)
Pt called in to speak to cma but we got disconnected.

## 2022-06-07 ENCOUNTER — Other Ambulatory Visit: Payer: Self-pay | Admitting: *Deleted

## 2022-06-07 MED ORDER — TAMSULOSIN HCL 0.4 MG PO CAPS: 0.4000 mg | ORAL_CAPSULE | Freq: Every day | ORAL | 3 refills | Status: DC

## 2022-06-07 MED ORDER — METFORMIN HCL ER 500 MG PO TB24: 500.0000 mg | ORAL_TABLET | Freq: Every day | ORAL | 1 refills | Status: DC

## 2022-06-07 NOTE — Progress Notes (Signed)
Please inform patient of the following:  His A1c is in the prediabetic range but the rest of his labs are all stable.  Do not expect the slightly elevated blood sugar to cause much of an issue with his urinary symptoms.  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  Recommend starting Flomax 0.4 mg daily for Sinarest help with his urinary symptoms.  For his A1c recommend starting metformin XR 500 mg daily to improve his numbers and lower risk of complication from diabetes.  Would like to see him back in 3 to 6 months to recheck A1c.

## 2022-06-07 NOTE — Telephone Encounter (Signed)
See results note. 

## 2022-07-07 ENCOUNTER — Other Ambulatory Visit: Payer: Self-pay | Admitting: Family Medicine

## 2022-08-14 ENCOUNTER — Other Ambulatory Visit: Payer: Self-pay | Admitting: Family Medicine

## 2022-08-16 ENCOUNTER — Other Ambulatory Visit: Payer: Self-pay | Admitting: Family Medicine

## 2022-08-20 ENCOUNTER — Other Ambulatory Visit: Payer: Self-pay | Admitting: Family Medicine

## 2022-10-11 ENCOUNTER — Other Ambulatory Visit: Payer: Self-pay | Admitting: Family Medicine

## 2022-11-14 ENCOUNTER — Other Ambulatory Visit: Payer: Self-pay | Admitting: Family Medicine

## 2022-11-23 ENCOUNTER — Telehealth: Payer: Self-pay | Admitting: Family Medicine

## 2022-11-23 NOTE — Telephone Encounter (Signed)
Prescription Request  11/23/2022  LOV: 06/05/2022  What is the name of the medication or equipment?  atorvastatin (LIPITOR) 20 MG tablet   metFORMIN (GLUCOPHAGE-XR) 500 MG 24 hr tablet   Have you contacted your pharmacy to request a refill? Yes   Which pharmacy would you like this sent to?  [facility] [address] [place], [place] - [address] [place] [address] Phone: [phone] Fax: [phone]    Patient notified that their request is being sent to the clinical staff for review and that they should receive a response within 2 business days.   Please advise at Mobile [phone] (mobile)

## 2022-11-27 ENCOUNTER — Ambulatory Visit (INDEPENDENT_AMBULATORY_CARE_PROVIDER_SITE_OTHER): Payer: BC Managed Care – PPO | Admitting: Family Medicine

## 2022-11-27 ENCOUNTER — Other Ambulatory Visit: Payer: Self-pay

## 2022-11-27 ENCOUNTER — Encounter: Payer: Self-pay | Admitting: Family Medicine

## 2022-11-27 VITALS — BP 112/78 | HR 62 | Temp 98.0°F | Resp 16 | Ht 68.5 in | Wt 191.6 lb

## 2022-11-27 DIAGNOSIS — Z125 Encounter for screening for malignant neoplasm of prostate: Secondary | ICD-10-CM

## 2022-11-27 DIAGNOSIS — R739 Hyperglycemia, unspecified: Secondary | ICD-10-CM

## 2022-11-27 DIAGNOSIS — Z0001 Encounter for general adult medical examination with abnormal findings: Secondary | ICD-10-CM

## 2022-11-27 DIAGNOSIS — E785 Hyperlipidemia, unspecified: Secondary | ICD-10-CM

## 2022-11-27 DIAGNOSIS — Z23 Encounter for immunization: Secondary | ICD-10-CM

## 2022-11-27 DIAGNOSIS — I10 Essential (primary) hypertension: Secondary | ICD-10-CM | POA: Diagnosis not present

## 2022-11-27 LAB — LIPID PANEL
Cholesterol: 133 mg/dL (ref 0–200)
HDL: 47.8 mg/dL (ref >39.00)
LDL Cholesterol: 72 mg/dL (ref 0–99)
NonHDL: 85.27
Total CHOL/HDL Ratio: 3
Triglycerides: 64 mg/dL (ref 0.0–149.0)
VLDL: 12.8 mg/dL (ref 0.0–40.0)

## 2022-11-27 LAB — CBC WITH DIFFERENTIAL/PLATELET
Basophils Absolute: 0 10*3/uL (ref 0.0–0.1)
Basophils Relative: 0.6 % (ref 0.0–3.0)
Eosinophils Absolute: 0.1 10*3/uL (ref 0.0–0.7)
Eosinophils Relative: 4.6 % (ref 0.0–5.0)
HCT: 42 % (ref 39.0–52.0)
Hemoglobin: 14.1 g/dL (ref 13.0–17.0)
Lymphocytes Relative: 46.1 % — ABNORMAL HIGH (ref 12.0–46.0)
Lymphs Abs: 1.4 10*3/uL (ref 0.7–4.0)
MCHC: 33.4 g/dL (ref 30.0–36.0)
MCV: 93.5 fl (ref 78.0–100.0)
Monocytes Absolute: 0.3 10*3/uL (ref 0.1–1.0)
Monocytes Relative: 8.8 % (ref 3.0–12.0)
Neutro Abs: 1.2 10*3/uL — ABNORMAL LOW (ref 1.4–7.7)
Neutrophils Relative %: 39.9 % — ABNORMAL LOW (ref 43.0–77.0)
Platelets: 170 10*3/uL (ref 150.0–400.0)
RBC: 4.5 Mil/uL (ref 4.22–5.81)
RDW: 14.5 % (ref 11.5–15.5)
WBC: 3 10*3/uL — ABNORMAL LOW (ref 4.0–10.5)

## 2022-11-27 LAB — HEMOGLOBIN A1C: Hgb A1c MFr Bld: 6.1 % (ref 4.6–6.5)

## 2022-11-27 LAB — COMPREHENSIVE METABOLIC PANEL
ALT: 23 U/L (ref 0–53)
AST: 23 U/L (ref 0–37)
Albumin: 4.2 g/dL (ref 3.5–5.2)
Alkaline Phosphatase: 45 U/L (ref 39–117)
BUN: 15 mg/dL (ref 6–23)
CO2: 28 mEq/L (ref 19–32)
Calcium: 9.1 mg/dL (ref 8.4–10.5)
Chloride: 105 mEq/L (ref 96–112)
Creatinine, Ser: 1.1 mg/dL (ref 0.40–1.50)
GFR: 71.4 mL/min (ref >60.00)
Glucose, Bld: 89 mg/dL (ref 70–99)
Potassium: 3.8 mEq/L (ref 3.5–5.1)
Sodium: 140 mEq/L (ref 135–145)
Total Bilirubin: 1.6 mg/dL — ABNORMAL HIGH (ref 0.2–1.2)
Total Protein: 7.1 g/dL (ref 6.0–8.3)

## 2022-11-27 LAB — TSH: TSH: 2.09 u[IU]/mL (ref 0.35–5.50)

## 2022-11-27 LAB — PSA: PSA: 0.74 ng/mL (ref 0.10–4.00)

## 2022-11-27 MED ORDER — ATORVASTATIN CALCIUM 20 MG PO TABS
ORAL_TABLET | ORAL | 1 refills | Status: DC
Start: 2022-11-27 — End: 2023-08-02

## 2022-11-27 MED ORDER — METFORMIN HCL ER 500 MG PO TB24: 500.0000 mg | ORAL_TABLET | Freq: Every day | ORAL | 1 refills | Status: DC

## 2022-11-27 MED ORDER — AMLODIPINE BESYLATE 5 MG PO TABS: 5.0000 mg | ORAL_TABLET | Freq: Every day | ORAL | 3 refills | Status: DC

## 2022-11-27 MED ORDER — KETOCONAZOLE 2 % EX CREA: 1.0000 | TOPICAL_CREAM | Freq: Two times a day (BID) | CUTANEOUS | 0 refills | Status: AC

## 2022-11-27 NOTE — Assessment & Plan Note (Signed)
On metformin 500 mg daily.  Check A1c today.

## 2022-11-27 NOTE — Progress Notes (Signed)
Chief Complaint:  Phillip Scott is a 62 y.o. male who presents today for his annual comprehensive physical exam.    Assessment/Plan:  New/Acute Problems: Rash Consistent with tinea versicolor.  Will start topical ketoconzole.  He will let us know if not improving.  Chronic Problems Addressed Today: Essential hypertension Initially elevated but at goal on recheck.  Continue amlodipine 5 mg daily and losartan 100 mg daily.  We discussed importance of home blood pressure monitoring.  He will let us know if persistently elevated at home.  Dyslipidemia Continue Lipitor 20 mg daily.  Check lipids today.  Hyperglycemia On metformin 500 mg daily.  Check A1c today.  Preventative Healthcare: Has upcoming colonoscopy.  Will check labs today.  Shingrix given today.  Patient Counseling(The following topics were reviewed and/or handout was given):  -Nutrition: Stressed importance of moderation in sodium/caffeine intake, saturated fat and cholesterol, caloric balance, sufficient intake of fresh fruits, vegetables, and fiber.  -Stressed the importance of regular exercise.   -Substance Abuse: Discussed cessation/primary prevention of tobacco, alcohol, or other drug use; driving or other dangerous activities under the influence; availability of treatment for abuse.   -Injury prevention: Discussed safety belts, safety helmets, smoke detector, smoking near bedding or upholstery.   -Sexuality: Discussed sexually transmitted diseases, partner selection, use of condoms, avoidance of unintended pregnancy and contraceptive alternatives.   -Dental health: Discussed importance of regular tooth brushing, flossing, and dental visits.  -Health maintenance and immunizations reviewed. Please refer to Health maintenance section.  Return to care in 1 year for next preventative visit.     Subjective:  HPI:  He has no acute complaints today.   He has been having some issues with recurring rash on upper  chest. Usually gets worse in the spring and summer. Tried OTC meds without much improvement.   Lifestyle Diet: Balanced. Plenty of fruits and vegetables.  Exercise: Plenty of steps in at work.      11/27/2022   10:34 AM  Depression screen PHQ 2/9  Decreased Interest 0  Down, Depressed, Hopeless 0  PHQ - 2 Score 0    Health Maintenance Due  Topic Date Due   Zoster Vaccines- Shingrix (1 of 2) Never done   COLONOSCOPY (Pts 45-59yrs Insurance coverage will need to be confirmed)  11/16/2022     ROS: Per HPI, otherwise a complete review of systems was negative.   PMH:  The following were reviewed and entered/updated in epic: Past Medical History:  Diagnosis Date   Allergic rhinitis    Hypertension    Patient Active Problem List   Diagnosis Date Noted   Low back pain 06/05/2022   Hyperglycemia 12/17/2020   Dyslipidemia 12/15/2020   OSA (obstructive sleep apnea) 06/13/2013   Hematuria, unspecified 06/18/2012   Allergic rhinitis 11/05/2008   Essential hypertension 04/24/2007   Past Surgical History:  Procedure Laterality Date   KNEE SURGERY     x 2     Family History  Problem Relation Age of Onset   Hypertension Father    Hypertension Other        multiple family members    Medications- reviewed and updated Current Outpatient Medications  Medication Sig Dispense Refill   cyclobenzaprine (FLEXERIL) 10 MG tablet Take 1 tablet (10 mg total) by mouth 3 (three) times daily as needed for muscle spasms. 30 tablet 0   ketoconazole (NIZORAL) 2 % cream Apply 1 Application topically 2 (two) times daily. 60 g 0   losartan (COZAAR) 100 MG tablet  Take 1 tablet by mouth once daily 90 tablet 0   amLODipine (NORVASC) 5 MG tablet Take 1 tablet (5 mg total) by mouth daily. 90 tablet 3   atorvastatin (LIPITOR) 20 MG tablet TAKE 1 TABLET BY MOUTH ONCE DAILY . APPOINTMENT REQUIRED FOR FUTURE REFILLS 90 tablet 1   metFORMIN (GLUCOPHAGE-XR) 500 MG 24 hr tablet Take 1 tablet (500 mg total)  by mouth daily with breakfast. 90 tablet 1   No current facility-administered medications for this visit.    Allergies-reviewed and updated Allergies  Allergen Reactions   Lisinopril Cough    Social History   Socioeconomic History   Marital status: Married    Spouse name: Not on file   Number of children: Not on file   Years of education: Not on file   Highest education level: Not on file  Occupational History   Occupation: Housekeeper  Tobacco Use   Smoking status: Never   Smokeless tobacco: Never  Substance and Sexual Activity   Alcohol use: No   Drug use: No   Sexual activity: Not on file  Other Topics Concern   Not on file  Social History Narrative   Not on file   Social Determinants of Health   Financial Resource Strain: Not on file  Food Insecurity: Not on file  Transportation Needs: Not on file  Physical Activity: Not on file  Stress: Not on file  Social Connections: Not on file        Objective:  Physical Exam: BP 112/78   Pulse 62   Temp 98 F (36.7 C) (Temporal)   Resp 16   Ht 5' 8.5" (1.74 m)   Wt 191 lb 9.6 oz (86.9 kg)   SpO2 97%   BMI 28.71 kg/m   Body mass index is 28.71 kg/m. Wt Readings from Last 3 Encounters:  11/27/22 191 lb 9.6 oz (86.9 kg)  06/05/22 197 lb 6.4 oz (89.5 kg)  06/08/21 191 lb 3.2 oz (86.7 kg)   Gen: NAD, resting comfortably HEENT: TMs normal bilaterally. OP clear. No thyromegaly noted.  CV: RRR with no murmurs appreciated Pulm: NWOB, CTAB with no crackles, wheezes, or rhonchi GI: Normal bowel sounds present. Soft, Nontender, Nondistended. MSK: no edema, cyanosis, or clubbing noted Skin: warm, dry, hypopigmented rash on upper chest Neuro: CN2-12 grossly intact. Strength 5/5 in upper and lower extremities. Reflexes symmetric and intact bilaterally.  Psych: Normal affect and thought content     Rachael Ferrie M. [name], MD 11/27/2022 12:38 PM

## 2022-11-27 NOTE — Addendum Note (Signed)
Addended by: Laddie Aquas A on: 11/27/2022 01:19 PM   Modules accepted: Orders

## 2022-11-27 NOTE — Assessment & Plan Note (Signed)
Initially elevated but at goal on recheck.  Continue amlodipine 5 mg daily and losartan 100 mg daily.  We discussed importance of home blood pressure monitoring.  He will let us know if persistently elevated at home.

## 2022-11-27 NOTE — Assessment & Plan Note (Signed)
Continue Lipitor 20 mg daily.  Check lipids today.

## 2022-11-27 NOTE — Telephone Encounter (Signed)
Medication refilled at [facility] today

## 2022-11-28 NOTE — Progress Notes (Signed)
A1c is borderline elevated but better compared to previous.  Cholesterol levels are up a bit but at goal.This rest of his labs are all stable compared to previous.  Do not need to make any changes to his treatment plan.  He should continue to work on diet and exercise and we can recheck everything in a year or so.

## 2022-12-21 ENCOUNTER — Other Ambulatory Visit: Payer: Self-pay | Admitting: Family Medicine

## 2023-04-29 ENCOUNTER — Other Ambulatory Visit: Payer: Self-pay | Admitting: Family Medicine

## 2023-08-01 ENCOUNTER — Other Ambulatory Visit: Payer: Self-pay | Admitting: Family Medicine

## 2023-08-29 ENCOUNTER — Other Ambulatory Visit: Payer: Self-pay | Admitting: Family Medicine

## 2023-09-14 ENCOUNTER — Ambulatory Visit: Payer: Self-pay | Admitting: Family Medicine

## 2023-09-14 NOTE — Addendum Note (Signed)
Addended by: Micael Hampshire on: 09/14/2023 02:34 PM   Modules accepted: Orders, Level of Service

## 2023-09-14 NOTE — Telephone Encounter (Signed)
Summary: Frequent Urination   Copied From CRM [id number]. Reason for Triage: Patient states he's having some frequent urination. No pain or any other symptoms        Patient's phone is disconnected and no other numbers listed.

## 2023-09-14 NOTE — Telephone Encounter (Signed)
This encounter was created in error - please disregard.

## 2023-09-18 ENCOUNTER — Encounter: Payer: Self-pay | Admitting: Family Medicine

## 2023-09-18 ENCOUNTER — Ambulatory Visit: Payer: 59 | Admitting: Family Medicine

## 2023-09-18 VITALS — BP 168/80 | HR 80 | Temp 97.3°F | Ht 68.5 in | Wt 202.4 lb

## 2023-09-18 DIAGNOSIS — R350 Frequency of micturition: Secondary | ICD-10-CM | POA: Diagnosis not present

## 2023-09-18 DIAGNOSIS — R739 Hyperglycemia, unspecified: Secondary | ICD-10-CM

## 2023-09-18 DIAGNOSIS — I10 Essential (primary) hypertension: Secondary | ICD-10-CM | POA: Diagnosis not present

## 2023-09-18 LAB — URINALYSIS, ROUTINE W REFLEX MICROSCOPIC
Bilirubin Urine: NEGATIVE
Hgb urine dipstick: NEGATIVE
Ketones, ur: NEGATIVE
Leukocytes,Ua: NEGATIVE
Nitrite: NEGATIVE
RBC / HPF: NONE SEEN (ref 0–?)
Specific Gravity, Urine: 1.015 (ref 1.000–1.030)
Urine Glucose: NEGATIVE
Urobilinogen, UA: 0.2 (ref 0.0–1.0)
WBC, UA: NONE SEEN (ref 0–?)
pH: 7.5 (ref 5.0–8.0)

## 2023-09-18 LAB — COMPREHENSIVE METABOLIC PANEL
ALT: 25 U/L (ref 0–53)
AST: 23 U/L (ref 0–37)
Albumin: 4.4 g/dL (ref 3.5–5.2)
Alkaline Phosphatase: 56 U/L (ref 39–117)
BUN: 21 mg/dL (ref 6–23)
CO2: 24 meq/L (ref 19–32)
Calcium: 9 mg/dL (ref 8.4–10.5)
Chloride: 106 meq/L (ref 96–112)
Creatinine, Ser: 1.07 mg/dL (ref 0.40–1.50)
GFR: 73.39 mL/min (ref >60.00)
Glucose, Bld: 107 mg/dL — ABNORMAL HIGH (ref 70–99)
Potassium: 3.9 meq/L (ref 3.5–5.1)
Sodium: 139 meq/L (ref 135–145)
Total Bilirubin: 1.3 mg/dL — ABNORMAL HIGH (ref 0.2–1.2)
Total Protein: 7.3 g/dL (ref 6.0–8.3)

## 2023-09-18 LAB — PSA: PSA: 0.68 ng/mL (ref 0.10–4.00)

## 2023-09-18 LAB — CBC
HCT: 42.8 % (ref 39.0–52.0)
Hemoglobin: 14.5 g/dL (ref 13.0–17.0)
MCHC: 33.9 g/dL (ref 30.0–36.0)
MCV: 94.7 fL (ref 78.0–100.0)
Platelets: 184 10*3/uL (ref 150.0–400.0)
RBC: 4.52 Mil/uL (ref 4.22–5.81)
RDW: 13.8 % (ref 11.5–15.5)
WBC: 3.9 10*3/uL — ABNORMAL LOW (ref 4.0–10.5)

## 2023-09-18 LAB — HEMOGLOBIN A1C: Hgb A1c MFr Bld: 6.5 % (ref 4.6–6.5)

## 2023-09-18 NOTE — Assessment & Plan Note (Signed)
No longer on metformin due to concern for side effects.  Will check A1c today with labs.  May be contributing to his above urinary frequency.

## 2023-09-18 NOTE — Assessment & Plan Note (Signed)
Elevated today.  He was at goal last office visit.  He has not taken his blood pressure medicine today.  Will continue amlodipine 5 mg daily and losartan 100 mg daily.  We discussed importance of home monitoring.  He will come back in a few months for CPE and we can recheck at that time though he will let us know if persistently elevated at home.

## 2023-09-18 NOTE — Progress Notes (Signed)
   Phillip Scott is a 63 y.o. male who presents today for an office visit.  Assessment/Plan:  New/Acute Problems: Urinary Frequency  No red flags.  Will check UA, urine culture, A1c, PSA, CBC, and c-Met.  Likely due to BPH however need to rule out hyperglycemia, UTI, etc. as above.  Chronic Problems Addressed Today: Essential hypertension Elevated today.  He was at goal last office visit.  He has not taken his blood pressure medicine today.  Will continue amlodipine 5 mg daily and losartan 100 mg daily.  We discussed importance of home monitoring.  He will come back in a few months for CPE and we can recheck at that time though he will let us know if persistently elevated at home.  Hyperglycemia No longer on metformin due to concern for side effects.  Will check A1c today with labs.  May be contributing to his above urinary frequency.     Subjective:  HPI:  See Assessment / plan for status of chronic conditions.  He is here today with urinary frequency. This comes and goes. No nocturia. No hematuria. No dysuria. This has been going on for several months. No fevers or chills.        Objective:  Physical Exam: BP (!) 168/80   Pulse 80   Temp (!) 97.3 F (36.3 C) (Temporal)   Ht 5' 8.5" (1.74 m)   Wt 202 lb 6.4 oz (91.8 kg)   SpO2 98%   BMI 30.33 kg/m   Gen: No acute distress, resting comfortably CV: Regular rate and rhythm with no murmurs appreciated Pulm: Normal work of breathing, clear to auscultation bilaterally with no crackles, wheezes, or rhonchi Neuro: Grossly normal, moves all extremities Psych: Normal affect and thought content      Phillip Kerins M. [name], MD 09/18/2023 8:27 AM

## 2023-09-18 NOTE — Patient Instructions (Signed)
It was very nice to see you today!  We will check blood work and a urine sample.  Please keep an eye on your blood pressure and let us know if persistently elevated.  Return if symptoms worsen or fail to improve.   Take care, Dr [name]  PLEASE NOTE:  If you had any lab tests, please let us know if you have not heard back within a few days. You may see your results on mychart before we have a chance to review them but we will give you a call once they are reviewed by [place].   If we ordered any referrals today, please let us know if you have not heard from their office within the next week.   If you had any urgent prescriptions sent in today, please check with the pharmacy within an hour of our visit to make sure the prescription was transmitted appropriately.   Please try these tips to maintain a healthy lifestyle:  Eat at least 3 REAL meals and 1-2 snacks per day.  Aim for no more than 5 hours between eating.  If you eat breakfast, please do so within one hour of getting up.   Each meal should contain half fruits/vegetables, one quarter protein, and one quarter carbs (no bigger than a computer mouse)  Cut down on sweet beverages. This includes juice, soda, and sweet tea.   Drink at least 1 glass of water with each meal and aim for at least 8 glasses per day  Exercise at least 150 minutes every week.

## 2023-09-19 LAB — URINE CULTURE
MICRO NUMBER:: 16098463
Result:: NO GROWTH
SPECIMEN QUALITY:: ADEQUATE

## 2023-09-21 ENCOUNTER — Encounter: Payer: Self-pay | Admitting: Family Medicine

## 2023-09-21 NOTE — Progress Notes (Signed)
His blood sugar is elevated.  This is probably what is causing most of his frequent urination.  His prostate number is normal.  His urine culture is negative.  No signs of UTI.  We need to work on getting his sugar lower.      Recommend we increase his metformin to 1000 mg daily.  I would like to for him to follow-up with [place] in 3 months.  He should let us know if symptoms are not improving.

## 2023-09-25 ENCOUNTER — Other Ambulatory Visit: Payer: Self-pay | Admitting: *Deleted

## 2023-09-25 MED ORDER — METFORMIN HCL 1000 MG PO TABS: 1000.0000 mg | ORAL_TABLET | Freq: Two times a day (BID) | ORAL | 3 refills | Status: AC

## 2023-10-09 IMAGING — DX DG LUMBAR SPINE COMPLETE 4+V
5 series · 5 of 5 positions shown · non-contrast
Comparison: 08/23/2004

CLINICAL DATA: Back pain

EXAM:
LUMBAR SPINE - COMPLETE 4+ VIEW

[l-spine ap]
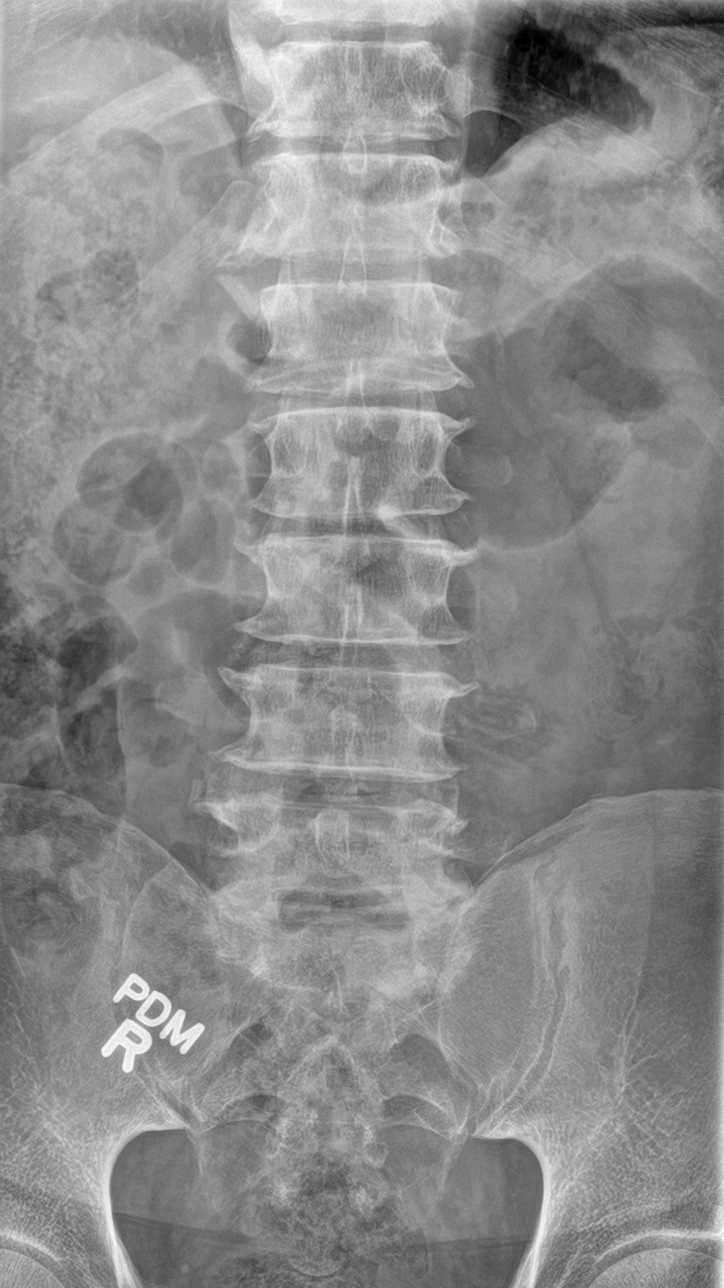

[l-spine obl (1 of 2)]
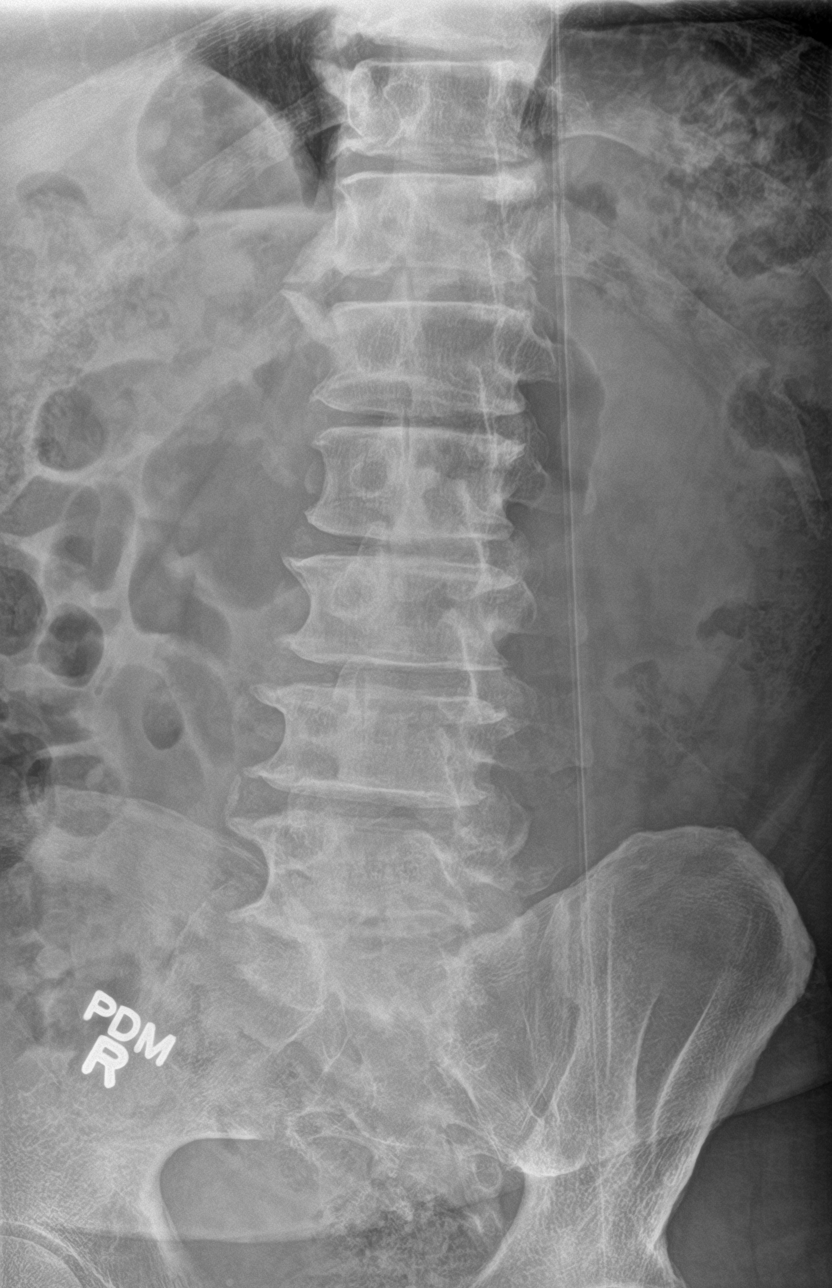

[l-spine obl (2 of 2)]
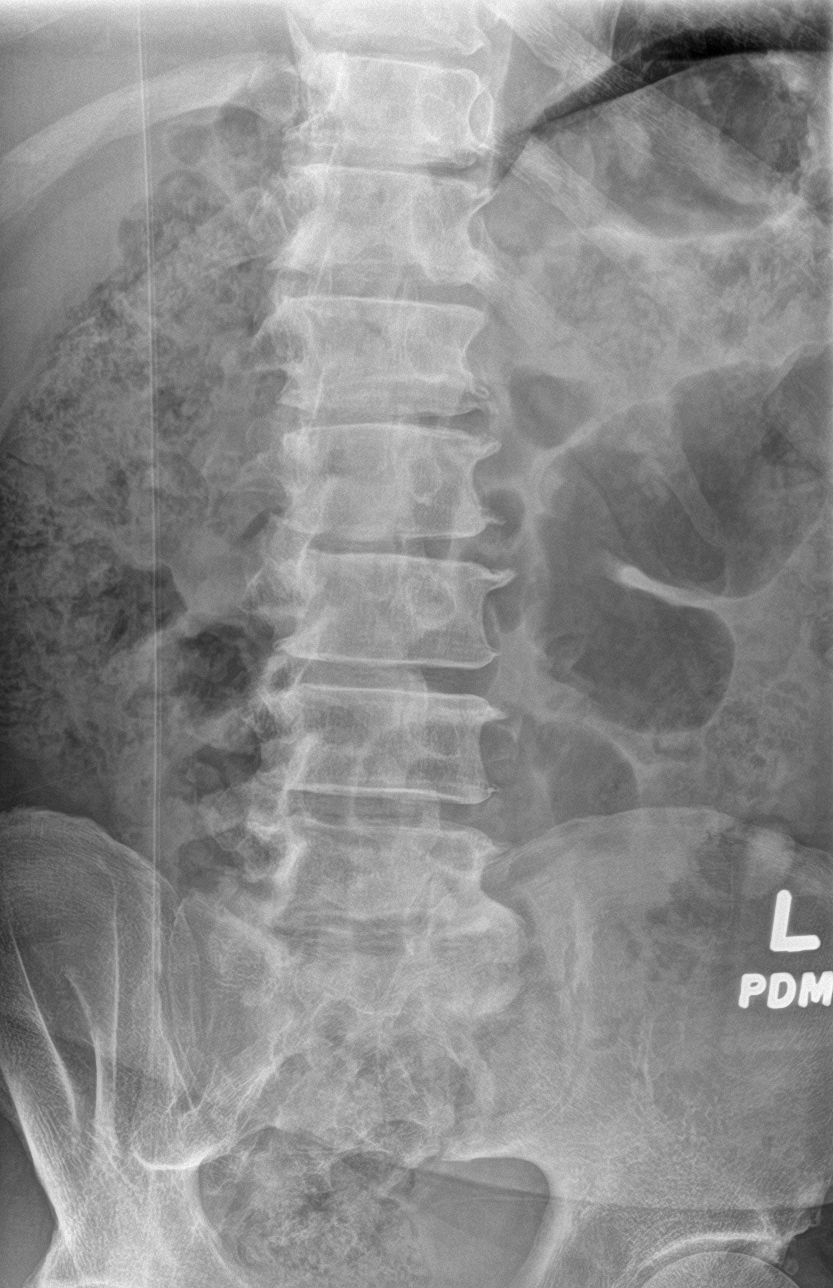

[l-spine lat]
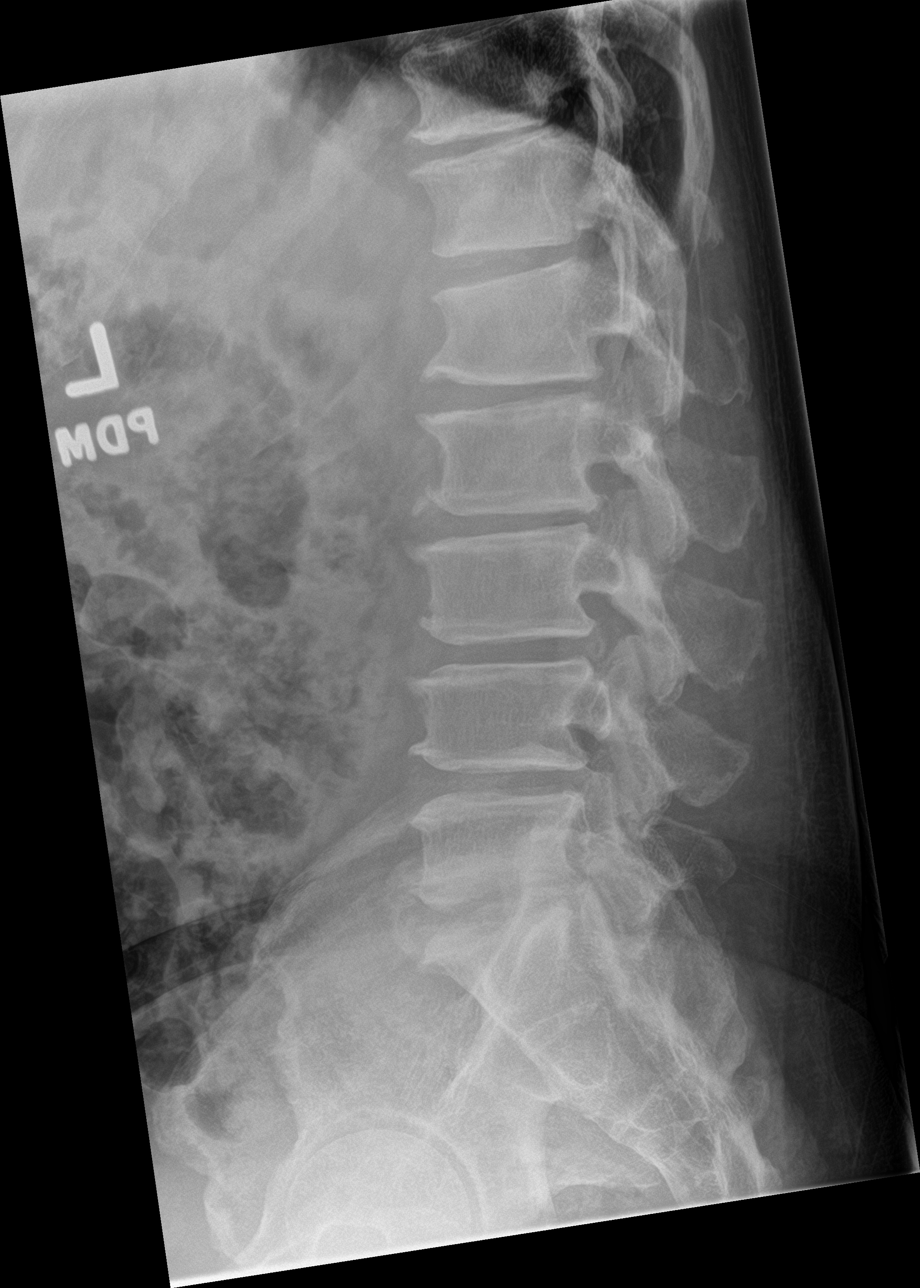

[l-spine spot]
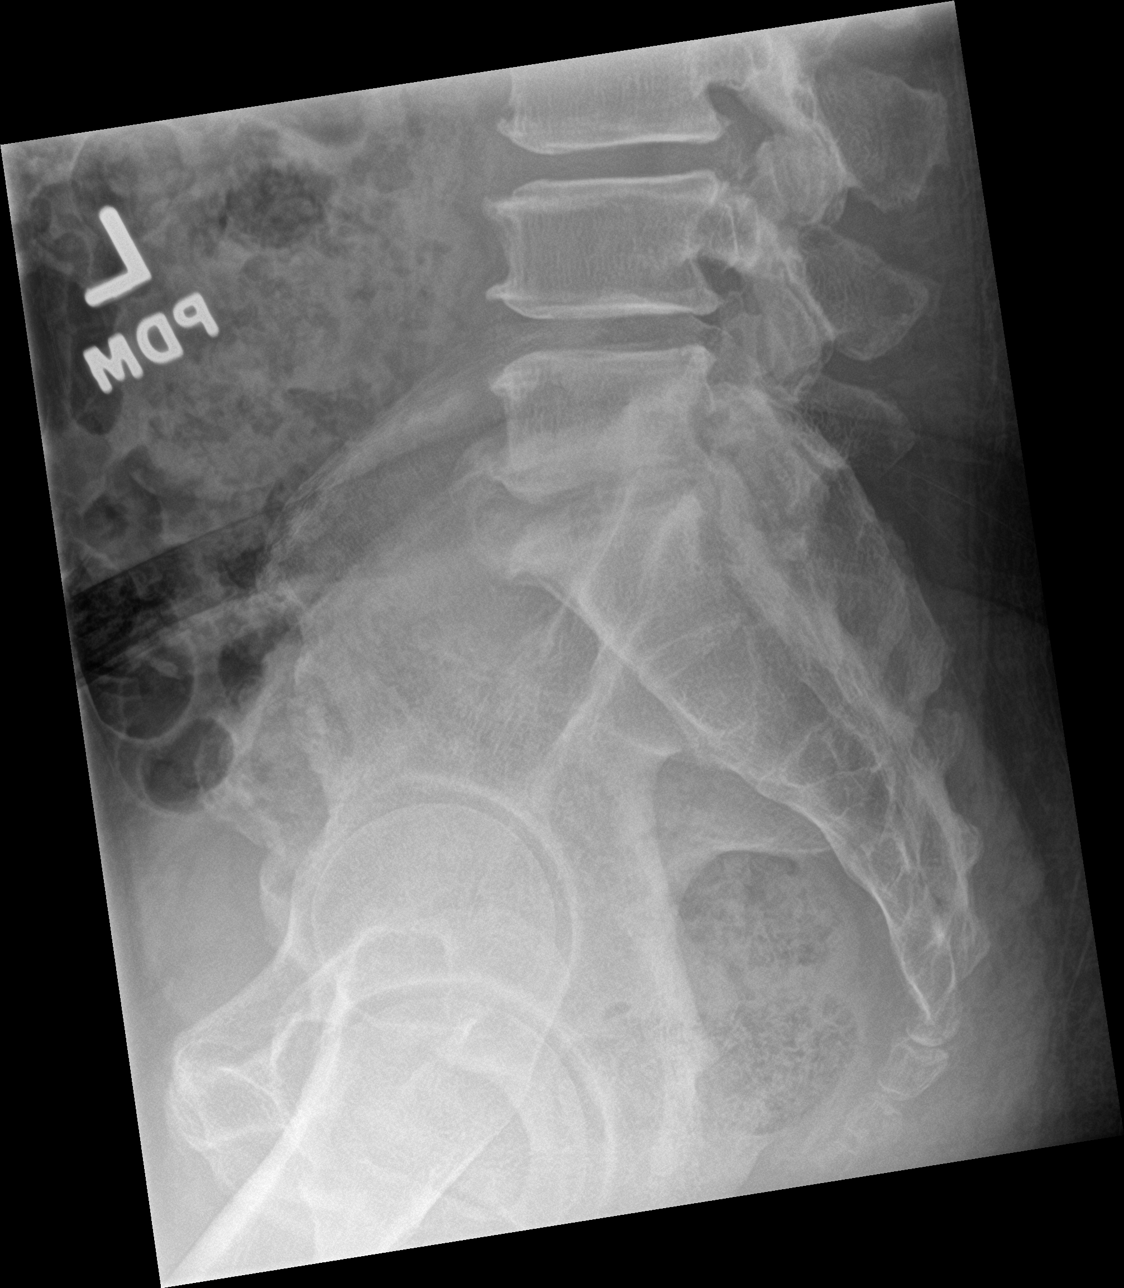

[5 of 5 positions shown; findings below may reference images not displayed]

FINDINGS: Five non rib-bearing lumbar type vertebra. Vertebral body heights
are maintained. Mild chronic wedging at L1 and T12. Mild diffuse
disc space narrowing and osteophyte L1 through L5 with more moderate
degenerative changes at L5-S1. Mild facet degenerative changes of
the lower lumbar spine.
IMPRESSION: Diffuse degenerative changes.  No acute osseous abnormality

## 2023-11-23 ENCOUNTER — Other Ambulatory Visit: Payer: Self-pay | Admitting: Family Medicine

## 2023-12-16 ENCOUNTER — Other Ambulatory Visit: Payer: Self-pay | Admitting: Family Medicine

## 2023-12-24 ENCOUNTER — Other Ambulatory Visit: Payer: Self-pay | Admitting: Family Medicine

## 2024-01-25 ENCOUNTER — Ambulatory Visit (INDEPENDENT_AMBULATORY_CARE_PROVIDER_SITE_OTHER): Admitting: Family Medicine

## 2024-01-25 VITALS — BP 151/88 | HR 65 | Temp 97.2°F | Ht 68.5 in | Wt 193.0 lb

## 2024-01-25 DIAGNOSIS — I10 Essential (primary) hypertension: Secondary | ICD-10-CM | POA: Diagnosis not present

## 2024-01-25 DIAGNOSIS — M25561 Pain in right knee: Secondary | ICD-10-CM | POA: Diagnosis not present

## 2024-01-25 MED ORDER — AMLODIPINE BESYLATE 5 MG PO TABS: 5.0000 mg | ORAL_TABLET | Freq: Every day | ORAL | 3 refills | Status: AC

## 2024-01-25 MED ORDER — DICLOFENAC SODIUM 75 MG PO TBEC: 75.0000 mg | DELAYED_RELEASE_TABLET | Freq: Two times a day (BID) | ORAL | 0 refills | Status: AC

## 2024-01-25 MED ORDER — LOSARTAN POTASSIUM 100 MG PO TABS: 100.0000 mg | ORAL_TABLET | Freq: Every day | ORAL | 3 refills | Status: AC

## 2024-01-25 NOTE — Assessment & Plan Note (Addendum)
 Elevated today in setting of acute pain.  Typically has been well-controlled.  Will continue his amlodipine  5 mg daily and losartan  100 mg daily.  He will monitor at home and follow-up with us  in a few weeks if persistently elevated.

## 2024-01-25 NOTE — Progress Notes (Signed)
   Phillip Scott is a 63 y.o. male who presents today for an office visit.  Assessment/Plan:  New/Acute Problems: Right Knee Pain  Likely osteoarthritis.  No red flag signs or symptoms.  We discussed conservative measures including compression and icing.  Will start diclofenac  75 mg twice daily for the next 1 to 2 weeks.  He will let us  know if not improving and would consider imaging versus referral at that time.  Chronic Problems Addressed Today: Essential hypertension Elevated today in setting of acute pain.  Typically has been well-controlled.  Will continue his amlodipine  5 mg daily and losartan  100 mg daily.  He will monitor at home and follow-up with us  in a few weeks if persistently elevated.     Subjective:  HPI:  See Assessment / plan for status of chronic conditions. Patient here today with right knee pain. This flared up about a month ago.  No obvious injuries or precipitating events. Pain is worse after sitting for a period of time. Gets better when moving around for awhile. He did have right knee surgery for torn cartilage when he was in his 20's. No treatments tried. Symptoms are getting worse.        Objective:  Physical Exam: BP (!) 151/88   Pulse 65   Temp (!) 97.2 F (36.2 C) (Temporal)   Ht 5' 8.5 (1.74 m)   Wt 193 lb (87.5 kg)   SpO2 97%   BMI 28.92 kg/m   Gen: No acute distress, resting comfortably MUSCULOSKELETAL - right Knee: Surgical scar present along left medial knee.  No other deformities noted.  Crepitus noted with active and passive range of motion.  No midline tenderness.  Stable to varus and valgus stress.  Anterior and posterior drawer signs negative.  Neurovascular intact distally. Neuro: Grossly normal, moves all extremities Psych: Normal affect and thought content      Synethia Endicott M. Kennyth, MD 01/25/2024 9:20 AM

## 2024-01-25 NOTE — Patient Instructions (Addendum)
 It was very nice to see you today!  You have arthritis in your knee.  Please use ice and compression of the area.  Start the diclofenac  for the next 1 to 2 weeks.  Let us  know if not improving.  Please monitor your blood pressure at home and let us  know if persistently elevated at home.  Return if symptoms worsen or fail to improve.   Take care, Dr Kennyth  PLEASE NOTE:  If you had any lab tests, please let us  know if you have not heard back within a few days. You may see your results on mychart before we have a chance to review them but we will give you a call once they are reviewed by us .   If we ordered any referrals today, please let us  know if you have not heard from their office within the next week.   If you had any urgent prescriptions sent in today, please check with the pharmacy within an hour of our visit to make sure the prescription was transmitted appropriately.   Please try these tips to maintain a healthy lifestyle:  Eat at least 3 REAL meals and 1-2 snacks per day.  Aim for no more than 5 hours between eating.  If you eat breakfast, please do so within one hour of getting up.   Each meal should contain half fruits/vegetables, one quarter protein, and one quarter carbs (no bigger than a computer mouse)  Cut down on sweet beverages. This includes juice, soda, and sweet tea.   Drink at least 1 glass of water with each meal and aim for at least 8 glasses per day  Exercise at least 150 minutes every week.

## 2024-04-20 ENCOUNTER — Other Ambulatory Visit: Payer: Self-pay | Admitting: Family Medicine

## 2024-08-12 ENCOUNTER — Ambulatory Visit: Payer: Self-pay | Admitting: *Deleted

## 2024-08-12 NOTE — Telephone Encounter (Signed)
Patient need OV

## 2024-08-12 NOTE — Telephone Encounter (Signed)
 No available appt today. Recommended UC. Patient wants appt . None available with PCP until 08/14/24. Please advise.        FYI Only or Action Required?: Action required by provider: request for appointment.  Patient was last seen in primary care on 01/25/2024 by Kennyth Worth HERO, MD.  Called Nurse Triage reporting Shoulder Pain.  Symptoms began several weeks ago.  Interventions attempted: Other: stretching .  Symptoms are: gradually worsening.  Triage Disposition: See HCP Within 4 Hours (Or PCP Triage)  Patient/caregiver understands and will follow disposition?: No, wishes to speak with PCP               Copied from CRM #[id number]. Topic: Clinical - Red Word Triage >> Aug 12, 2024 10:39 AM Adelita E wrote: [facility] that prompted transfer to Nurse Triage: Left shoulder pain going on for 2-3 weeks. When patient turns head or stretches it causes increased pain. Reason for Disposition  [1] Shoulder pains with exertion (e.g., walking) AND [2] pain goes away on resting AND [3] not present now  Answer Assessment - Initial Assessment Questions Recommended UC due to pain with movement at gone at rest. Patient requesting appt with PCP. None available until 08/14/24. Patient does not want to go to UC if PCP will allow appt to be scheduled. Denies chest pain no difficulty breathing .  Pain noted with turning head to left under armpit area. Patient is left hand dominant. Please advise. Patient requesting call back.               1. ONSET: When did the pain start?     2-3 weeks ago  2. LOCATION: Where is the pain located?     Left shoulder under armpit  3. PAIN: How bad is the pain? (Scale 1-10; or mild, moderate, severe)     Worsening pain with movement. Bending to left 4. WORK OR EXERCISE: Has there been any recent work or exercise that involved this part of the body?     Na  5. CAUSE: What do you think is causing the shoulder pain?     Not sure  6. OTHER  SYMPTOMS: Do you have any other symptoms? (e.g., neck pain, swelling, rash, fever, numbness, weakness)     Left shoulder pain , neck pain with movement  numbness every now and then . 7. PREGNANCY: Is there any chance you are pregnant? When was your last menstrual period?     na  Protocols used: Shoulder Pain-A-AH

## 2024-08-12 NOTE — Telephone Encounter (Signed)
 Pt scheduled with Wendolyn on 08/13/24 /

## 2024-08-13 ENCOUNTER — Ambulatory Visit: Admitting: Family Medicine

## 2024-08-13 ENCOUNTER — Encounter: Payer: Self-pay | Admitting: Family Medicine

## 2024-08-13 VITALS — BP 144/86 | HR 78 | Temp 97.9°F | Ht 68.5 in | Wt 190.0 lb

## 2024-08-13 DIAGNOSIS — M5412 Radiculopathy, cervical region: Secondary | ICD-10-CM | POA: Diagnosis not present

## 2024-08-13 MED ORDER — CYCLOBENZAPRINE HCL 10 MG PO TABS: 10.0000 mg | ORAL_TABLET | Freq: Every evening | ORAL | 0 refills | Status: AC | PRN

## 2024-08-13 NOTE — Patient Instructions (Signed)
 Diclofenac -use that or ibuprofen 400-600mg  3x/day  Heat, massage, icey hot  Stretches.   If worse, not better, can do physical therapy

## 2024-08-13 NOTE — Progress Notes (Signed)
 "  Subjective:     Patient ID: Phillip Scott, male    DOB: [birth year], 64 y.o.   MRN: [id number]  Chief Complaint  Patient presents with   Shoulder Pain    Pt is having Left shoulder pain x3 weeks    Discussed the use of AI scribe software for clinical note transcription with the patient, who gave verbal consent to proceed.  History of Present Illness Phillip Scott is a 64 year old male who presents with left shoulder pain.  He experiences pain in his left shoulder, primarily noticeable upon waking and when turning his head to the left. The pain is localized to the shoulder area without radiation and is not exacerbated by shoulder use. He describes occasional tingling in his fingers but denies weakness. He has not taken any medications for the pain.  He denies any recent injuries, falls, or strains that could have contributed to the shoulder pain. No associated symptoms such as coughing, shortness of breath, or neck pain are present.  He is left-handed and predominantly uses his left hand for activities. 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  He has a history of hypertension and reports missing one dose of his blood pressure medication this week, which he attributes to his elevated blood pressure reading during the visit.    Health Maintenance Due  Topic Date Due   Pneumococcal Vaccine: 50+ Years (1 of 1 - PCV) Never done   Zoster Vaccines- Shingrix  (2 of 2) 01/22/2023   Influenza Vaccine  02/29/2024   COVID-19 Vaccine (4 - 2025-26 season) 03/31/2024    Past Medical History:  Diagnosis Date   Allergic rhinitis    Hypertension     Past Surgical History:  Procedure Laterality Date   KNEE SURGERY     x 2     Current Medications[1]  Allergies[2] ROS neg/noncontributory except as noted  HPI/below      Objective:     BP (!) 144/86   Pulse 78   Temp 97.9 F (36.6 C) (Temporal)   Ht 5' 8.5 (1.74 m)   Wt 190 lb (86.2 kg)   SpO2 98%   BMI 28.47 kg/m  Wt Readings from Last 3 Encounters:  08/13/24 190 lb (86.2 kg)  01/25/24 193 lb (87.5 kg)  09/18/23 202 lb 6.4 oz (91.8 kg)    Physical Exam VITALS: BP- 144/86 GENERAL: Well developed, well nourished, no acute distress. HEAD EYES EARS NOSE THROAT: Normocephalic, atraumatic, conjunctiva not injected, sclera nonicteric. CARDIAC: Regular rate and rhythm, S1 S2 present MUSCULOSKELETAL: No gross abnormalities. Shoulder range of motion normal. Spine non-tender. Left paraspinal muscles tight and posterior shoulder.  MS 5/5 BUE.  Phillip Scott NEUROLOGICAL: Alert and oriented x3, cranial nerves II through XII intact. PSYCHIATRIC: Normal mood, good eye contact.       Assessment & Plan:  Cervical radiculopathy  Other orders -     Cyclobenzaprine  HCl; Take 1 tablet (10 mg total) by mouth at bedtime as needed for muscle spasms.  Dispense: 15 tablet; Refill: 0    Assessment and Plan Assessment & Plan Cervical radiculopathy with muscle spasm   Left shoulder pain worsens with neck movement, especially when turning the head left. Pain is localized to the shoulder with occasional tingling in the fingers. No significant weakness or recent injury. Examination shows tight muscles in the left  shoulder and neck, with no significant pain on shoulder movement. Differential diagnosis suggests cervical radiculopathy from a pinched nerve, possibly aggravated by muscle spasm. No signs of rotator cuff injury. Pain with neck movement indicates nerve involvement rather than cardiac origin. Recommend ibuprofen or diclofenac  for inflammation. Advise heat and massage to relax muscles. Suggest shoulder circles and stretching exercises for muscle tightness. Prescribe Flexeril  as a muscle relaxer for nighttime use if needed. Offer referral to physical therapy  if symptoms persist despite home management.  Hypertension   Blood pressure is 144/86 mmHg, likely elevated due to a missed dose of antihypertensive medication. He missed a dose because of work schedule constraints. Advise setting an alarm or carrying an extra dose to prevent missed medication. Recommend monitoring blood pressure at home.     Return if symptoms worsen or fail to improve.  Jenkins CHRISTELLA Carrel, MD     [1]  Current Outpatient Medications:    amLODipine  (NORVASC ) 5 MG tablet, Take 1 tablet (5 mg total) by mouth daily., Disp: 90 tablet, Rfl: 3   atorvastatin  (LIPITOR) 20 MG tablet, TAKE 1 TABLET BY MOUTH ONCE DAILY . APPOINTMENT REQUIRED FOR FUTURE REFILLS, Disp: 90 tablet, Rfl: 0   diclofenac  (VOLTAREN ) 75 MG EC tablet, Take 1 tablet (75 mg total) by mouth 2 (two) times daily., Disp: 30 tablet, Rfl: 0   ketoconazole  (NIZORAL ) 2 % cream, Apply 1 Application topically 2 (two) times daily., Disp: 60 g, Rfl: 0   losartan  (COZAAR ) 100 MG tablet, Take 1 tablet (100 mg total) by mouth daily., Disp: 90 tablet, Rfl: 3   metFORMIN  (GLUCOPHAGE ) 1000 MG tablet, Take 1 tablet (1,000 mg total) by mouth 2 (two) times daily with a meal., Disp: 180 tablet, Rfl: 3   cyclobenzaprine  (FLEXERIL ) 10 MG tablet, Take 1 tablet (10 mg total) by mouth at bedtime as needed for muscle spasms., Disp: 15 tablet, Rfl: 0 [2]  Allergies Allergen Reactions   Lisinopril Cough   "

## 2025-01-02 ENCOUNTER — Encounter: Admitting: Family Medicine
